# Patient Record
Sex: Female | Born: 1975 | Race: Black or African American | Hispanic: No | Marital: Single | Smoking: Never smoker
Health system: Southern US, Community
[De-identification: ages and names within clinical notes are randomized; demographics above are authoritative.]

## PROBLEM LIST (undated history)

## (undated) DIAGNOSIS — F419 Anxiety disorder, unspecified: Secondary | ICD-10-CM

## (undated) DIAGNOSIS — T7840XA Allergy, unspecified, initial encounter: Secondary | ICD-10-CM

## (undated) DIAGNOSIS — Z9889 Other specified postprocedural states: Secondary | ICD-10-CM

## (undated) DIAGNOSIS — D573 Sickle-cell trait: Secondary | ICD-10-CM

## (undated) DIAGNOSIS — E559 Vitamin D deficiency, unspecified: Secondary | ICD-10-CM

## (undated) DIAGNOSIS — F329 Major depressive disorder, single episode, unspecified: Secondary | ICD-10-CM

## (undated) DIAGNOSIS — R112 Nausea with vomiting, unspecified: Secondary | ICD-10-CM

## (undated) DIAGNOSIS — M722 Plantar fascial fibromatosis: Secondary | ICD-10-CM

## (undated) DIAGNOSIS — K219 Gastro-esophageal reflux disease without esophagitis: Secondary | ICD-10-CM

## (undated) DIAGNOSIS — D571 Sickle-cell disease without crisis: Secondary | ICD-10-CM

## (undated) DIAGNOSIS — G43909 Migraine, unspecified, not intractable, without status migrainosus: Secondary | ICD-10-CM

## (undated) DIAGNOSIS — Z302 Encounter for sterilization: Secondary | ICD-10-CM

## (undated) DIAGNOSIS — B009 Herpesviral infection, unspecified: Secondary | ICD-10-CM

## (undated) DIAGNOSIS — D649 Anemia, unspecified: Secondary | ICD-10-CM

## (undated) DIAGNOSIS — G44209 Tension-type headache, unspecified, not intractable: Secondary | ICD-10-CM

## (undated) HISTORY — DX: Anxiety disorder, unspecified: F41.9

## (undated) HISTORY — DX: Gastro-esophageal reflux disease without esophagitis: K21.9

## (undated) HISTORY — DX: Allergy, unspecified, initial encounter: T78.40XA

## (undated) HISTORY — DX: Sickle-cell disease without crisis: D57.1

## (undated) HISTORY — DX: Anemia, unspecified: D64.9

## (undated) HISTORY — PX: BREAST REDUCTION SURGERY: SHX8

## (undated) HISTORY — DX: Sickle-cell trait: D57.3

---

## 1997-12-07 ENCOUNTER — Other Ambulatory Visit: Admission: RE | Admit: 1997-12-07 | Discharge: 1997-12-07 | Payer: Self-pay | Admitting: Obstetrics

## 1998-08-20 ENCOUNTER — Inpatient Hospital Stay (HOSPITAL_COMMUNITY): Admission: AD | Admit: 1998-08-20 | Discharge: 1998-08-20 | Payer: Self-pay | Admitting: Obstetrics

## 1998-10-04 ENCOUNTER — Encounter: Payer: Self-pay | Admitting: *Deleted

## 1998-10-04 ENCOUNTER — Ambulatory Visit: Admission: RE | Admit: 1998-10-04 | Discharge: 1998-10-04 | Payer: Self-pay | Admitting: *Deleted

## 1999-03-30 ENCOUNTER — Other Ambulatory Visit: Admission: RE | Admit: 1999-03-30 | Discharge: 1999-03-30 | Payer: Self-pay | Admitting: Obstetrics

## 2000-03-11 ENCOUNTER — Other Ambulatory Visit: Admission: RE | Admit: 2000-03-11 | Discharge: 2000-03-11 | Payer: Self-pay | Admitting: Obstetrics

## 2000-03-18 ENCOUNTER — Encounter: Payer: Self-pay | Admitting: Obstetrics

## 2000-03-18 ENCOUNTER — Inpatient Hospital Stay (HOSPITAL_COMMUNITY): Admission: AD | Admit: 2000-03-18 | Discharge: 2000-03-18 | Payer: Self-pay | Admitting: Obstetrics

## 2000-07-06 ENCOUNTER — Inpatient Hospital Stay (HOSPITAL_COMMUNITY): Admission: AD | Admit: 2000-07-06 | Discharge: 2000-07-06 | Payer: Self-pay | Admitting: Obstetrics

## 2000-10-24 ENCOUNTER — Inpatient Hospital Stay (HOSPITAL_COMMUNITY): Admission: AD | Admit: 2000-10-24 | Discharge: 2000-10-24 | Payer: Self-pay | Admitting: Obstetrics

## 2000-11-07 ENCOUNTER — Inpatient Hospital Stay (HOSPITAL_COMMUNITY): Admission: AD | Admit: 2000-11-07 | Discharge: 2000-11-10 | Payer: Self-pay | Admitting: Obstetrics

## 2004-11-29 ENCOUNTER — Emergency Department (HOSPITAL_COMMUNITY): Admission: EM | Admit: 2004-11-29 | Discharge: 2004-11-29 | Payer: Self-pay | Admitting: Emergency Medicine

## 2006-04-26 ENCOUNTER — Emergency Department (HOSPITAL_COMMUNITY): Admission: EM | Admit: 2006-04-26 | Discharge: 2006-04-26 | Payer: Self-pay | Admitting: Family Medicine

## 2006-12-03 ENCOUNTER — Encounter: Admission: RE | Admit: 2006-12-03 | Discharge: 2007-03-03 | Payer: Self-pay | Admitting: Family Medicine

## 2008-05-26 ENCOUNTER — Encounter: Admission: RE | Admit: 2008-05-26 | Discharge: 2008-05-26 | Payer: Self-pay | Admitting: Obstetrics and Gynecology

## 2010-12-29 NOTE — Discharge Summary (Signed)
[facility]  Patient:    Elizabeth Key, Elizabeth Key                        MRN: [id number] Adm. Date:  [id number] Disc. Date: [id number] Attending:  Venita Sheffield                           Discharge Summary  HISTORY:                      The patient is a 35 year old gravida 2, para 1-0-0-1, [facility] November 11, 2000. She is a previous C-section and desired repeat C-section. She had a positive GBS which was treated with ampicillin at 36 weeks.  HOSPITAL COURSE:              On admission her hemoglobin was 10.6. Post C-section it was 9. White count was 9.2 and 10.3. The patient underwent a repeat low transverse cesarean section on November 07, 2000. She had a female, Apgars 8/9, weighing 7 pounds 13 ounces. Postoperatively, she did well and was discharged on the third postoperative day, ambulatory and on a regular diet.  DISCHARGE FOLLOWUP:           The patient is to see me in six weeks.  DISCHARGE DIAGNOSIS:          Status post repeat low transverse cesarean section at term. DD:  12/12/00 TD:  12/12/00 Job: [id number]

## 2010-12-29 NOTE — Op Note (Signed)
[facility]  Patient:    RAYNAH, GOMES                        MRN: [id number] Proc. Date: 11/07/00 Adm. Date:  [id number] Attending:  Venita Sheffield                           Operative Report  PREOPERATIVE DIAGNOSIS:       Previous cesarean section at term, desires repeat.  POSTOPERATIVE DIAGNOSIS:  OPERATION:                    Repeat cesarean section.  SURGEON:                      Kathreen Cosier, M.D.  ASSISTANT:                    Deniece Ree, M.D.  ANESTHESIA:                   Spinal by Dr. Arby Barrette.  DESCRIPTION OF PROCEDURE:     The patient was placed on the operating table in the supine position. The abdomen was prepped and draped. The bladder was emptied with a Foley catheter. A transverse suprapubic incision was made through the old scar, carried down to the rectus fascia. The fascia was cleanly incised the length of the incision. The rectus muscles were retracted laterally. The peritoneum was incised longitudinally. A transverse incision was made in the visceroperitoneum above the bladder and the bladder was mobilized inferiorly. A transverse lower uterine incision was made. The patient delivered from the OP position of a female, Apgars 8 and 9, weighing 7 pounds 13 ounces. The placenta was anterior, removed manually, and the fluid was clear. The uterine cavity was cleaned with dry lap. The team was in attendance. The uterine incision was closed in one layer with continuous suture of #1 chromic. Hemostasis was satisfactory. The bladder flap was reattached with 2-0 chromic. The uterus was well contracted. Tubes are ovaries were normal. The abdomen was closed in layers: the peritoneum with continuous suture of 0 chromic, the fascia with continuous suture of 0 Dexon, and the skin closed with subcuticular stitch of 0 plain. The patient tolerated the procedure well and taken to the recovery room in good condition. DD:   11/07/00 TD:  11/07/00 Job: [id number]

## 2011-07-28 ENCOUNTER — Encounter: Payer: Self-pay | Admitting: *Deleted

## 2011-07-28 ENCOUNTER — Emergency Department (INDEPENDENT_AMBULATORY_CARE_PROVIDER_SITE_OTHER)
Admission: EM | Admit: 2011-07-28 | Discharge: 2011-07-28 | Disposition: A | Discharge status: Home / Self Care | Payer: Self-pay | Source: Home / Self Care | Attending: Family Medicine | Admitting: Family Medicine

## 2011-07-28 DIAGNOSIS — J069 Acute upper respiratory infection, unspecified: Secondary | ICD-10-CM

## 2011-07-28 MED ORDER — GUAIFENESIN-CODEINE 100-10 MG/5ML PO SYRP: 10.0000 mL | ORAL_SOLUTION | Freq: Four times a day (QID) | ORAL | Status: AC | PRN

## 2011-07-28 NOTE — ED Notes (Signed)
jpt with c/o cough/congestion x 2 weeks

## 2011-07-28 NOTE — ED Provider Notes (Signed)
History     CSN: [id number] Arrival date & time: 07/28/2011  9:17 AM   First MD Initiated Contact with Patient 07/28/11 [phone]      Chief Complaint  Patient presents with  . Cough  . Nasal Congestion    (Consider location/radiation/quality/duration/timing/severity/associated sxs/prior treatment) Patient is a 35 y.o. female presenting with cough. The history is provided by the patient.  Cough This is a new problem. The current episode started more than 1 week ago. The problem occurs constantly. The problem has not changed since onset.The cough is non-productive. There has been no fever. Pertinent negatives include no rhinorrhea, no sore throat and no shortness of breath. She has tried cough syrup for the symptoms. The treatment provided no relief. She is not a smoker.    History reviewed. No pertinent past medical history.  Past Surgical History  Procedure Date  . Breast reduction surgery   . Cesarean section     History reviewed. No pertinent family history.  History  Substance Use Topics  . Smoking status: Never Smoker   . Smokeless tobacco: Not on file  . Alcohol Use: No    OB History    Grav Para Term Preterm Abortions TAB SAB Ect Mult Living                  Review of Systems  Constitutional: Negative.   HENT: Negative for sore throat and rhinorrhea.   Respiratory: Positive for cough. Negative for shortness of breath.     Allergies  Review of patient's allergies indicates no known allergies.  Home Medications   Current Outpatient Rx  Name Route Sig Dispense Refill  . GUAIFENESIN ER 600 MG PO TB12 Oral Take 1,200 mg by mouth 2 (two) times daily.      . GUAIFENESIN 100 MG/5ML PO SYRP Oral Take 200 mg by mouth 3 (three) times daily as needed.      . GUAIFENESIN-CODEINE 100-10 MG/5ML PO SYRP Oral Take 10 mLs by mouth 4 (four) times daily as needed for cough. 180 mL 0    BP 133/84  Pulse 84  Temp(Src) 99.1 F (37.3 C) (Oral)  Resp 16  SpO2 97%  LMP  07/28/2011  Physical Exam  Nursing note and vitals reviewed. Constitutional: She appears well-developed and well-nourished.  HENT:  Head: Normocephalic.  Right Ear: External ear normal.  Left Ear: External ear normal.  Mouth/Throat: Oropharynx is clear and moist.  Eyes: Pupils are equal, round, and reactive to light.  Neck: Normal range of motion. Neck supple.  Cardiovascular: Normal rate, normal heart sounds and intact distal pulses.   Pulmonary/Chest: Effort normal and breath sounds normal.  Skin: Skin is warm and dry.    ED Course  Procedures (including critical care time)  Labs Reviewed - No data to display No results found.   1. URI (upper respiratory infection)       MDM          Barkley Bruns, MD 07/28/11 [phone]

## 2011-08-03 ENCOUNTER — Emergency Department (HOSPITAL_COMMUNITY): Payer: Self-pay

## 2011-08-03 ENCOUNTER — Encounter (HOSPITAL_COMMUNITY): Payer: Self-pay | Admitting: *Deleted

## 2011-08-03 ENCOUNTER — Emergency Department (HOSPITAL_COMMUNITY)
Admission: EM | Admit: 2011-08-03 | Discharge: 2011-08-04 | Disposition: A | Discharge status: Home / Self Care | Payer: Self-pay | Attending: Emergency Medicine | Admitting: Emergency Medicine

## 2011-08-03 DIAGNOSIS — R059 Cough, unspecified: Secondary | ICD-10-CM | POA: Insufficient documentation

## 2011-08-03 DIAGNOSIS — N760 Acute vaginitis: Secondary | ICD-10-CM | POA: Insufficient documentation

## 2011-08-03 DIAGNOSIS — B9689 Other specified bacterial agents as the cause of diseases classified elsewhere: Secondary | ICD-10-CM | POA: Insufficient documentation

## 2011-08-03 DIAGNOSIS — A499 Bacterial infection, unspecified: Secondary | ICD-10-CM | POA: Insufficient documentation

## 2011-08-03 DIAGNOSIS — R062 Wheezing: Secondary | ICD-10-CM | POA: Insufficient documentation

## 2011-08-03 DIAGNOSIS — N949 Unspecified condition associated with female genital organs and menstrual cycle: Secondary | ICD-10-CM | POA: Insufficient documentation

## 2011-08-03 DIAGNOSIS — R079 Chest pain, unspecified: Secondary | ICD-10-CM | POA: Insufficient documentation

## 2011-08-03 DIAGNOSIS — R05 Cough: Secondary | ICD-10-CM | POA: Insufficient documentation

## 2011-08-03 DIAGNOSIS — Z79899 Other long term (current) drug therapy: Secondary | ICD-10-CM | POA: Insufficient documentation

## 2011-08-03 DIAGNOSIS — R1110 Vomiting, unspecified: Secondary | ICD-10-CM | POA: Insufficient documentation

## 2011-08-03 DIAGNOSIS — J40 Bronchitis, not specified as acute or chronic: Secondary | ICD-10-CM | POA: Insufficient documentation

## 2011-08-03 DIAGNOSIS — R109 Unspecified abdominal pain: Secondary | ICD-10-CM | POA: Insufficient documentation

## 2011-08-03 LAB — COMPREHENSIVE METABOLIC PANEL
ALT: 18 U/L (ref 0–35)
AST: 18 U/L (ref 0–37)
CO2: 29 mEq/L (ref 19–32)
Calcium: 9.5 mg/dL (ref 8.4–10.5)
Chloride: 101 mEq/L (ref 96–112)
GFR calc Af Amer: >90 mL/min (ref >90)
GFR calc non Af Amer: 79 mL/min — ABNORMAL LOW (ref >90)
Glucose, Bld: 109 mg/dL — ABNORMAL HIGH (ref 70–99)
Sodium: 139 mEq/L (ref 135–145)
Total Bilirubin: 0.4 mg/dL (ref 0.3–1.2)

## 2011-08-03 LAB — URINALYSIS, ROUTINE W REFLEX MICROSCOPIC
Glucose, UA: NEGATIVE mg/dL
Specific Gravity, Urine: 1.017 (ref 1.005–1.030)
pH: 5.5 (ref 5.0–8.0)

## 2011-08-03 LAB — CBC
HCT: 38.5 % (ref 36.0–46.0)
MCV: 80 fL (ref 78.0–100.0)
RBC: 4.81 MIL/uL (ref 3.87–5.11)
WBC: 13.4 10*3/uL — ABNORMAL HIGH (ref 4.0–10.5)

## 2011-08-03 LAB — URINE MICROSCOPIC-ADD ON

## 2011-08-03 LAB — DIFFERENTIAL
Basophils Absolute: 0 10*3/uL (ref 0.0–0.1)
Eosinophils Relative: 1 % (ref 0–5)
Lymphocytes Relative: 32 % (ref 12–46)
Lymphs Abs: 4.2 10*3/uL — ABNORMAL HIGH (ref 0.7–4.0)
Monocytes Absolute: 0.7 10*3/uL (ref 0.1–1.0)
Neutro Abs: 8.2 10*3/uL — ABNORMAL HIGH (ref 1.7–7.7)

## 2011-08-03 LAB — WET PREP, GENITAL: Trich, Wet Prep: NONE SEEN

## 2011-08-03 MED ORDER — FLUCONAZOLE 200 MG PO TABS: 200.0000 mg | ORAL_TABLET | Freq: Every day | ORAL | Status: AC

## 2011-08-03 MED ORDER — HYDROCODONE-HOMATROPINE 5-1.5 MG/5ML PO SYRP: 5.0000 mL | ORAL_SOLUTION | Freq: Four times a day (QID) | ORAL | Status: AC | PRN

## 2011-08-03 MED ORDER — AZITHROMYCIN 250 MG PO TABS: 250.0000 mg | ORAL_TABLET | Freq: Every day | ORAL | Status: AC

## 2011-08-03 MED ORDER — METRONIDAZOLE 500 MG PO TABS: 500.0000 mg | ORAL_TABLET | Freq: Two times a day (BID) | ORAL | Status: AC

## 2011-08-03 MED ORDER — ALBUTEROL SULFATE HFA 108 (90 BASE) MCG/ACT IN AERS
2.0000 | INHALATION_SPRAY | Freq: Once | RESPIRATORY_TRACT | Status: AC
  Administered 2011-08-03: 2 via RESPIRATORY_TRACT
  Filled 2011-08-03: qty 6.7

## 2011-08-03 MED ORDER — PREDNISONE 10 MG PO TABS: 40.0000 mg | ORAL_TABLET | Freq: Every day | ORAL | Status: AC

## 2011-08-03 NOTE — ED Notes (Addendum)
Pt in c/o cough x3-4 weeks, states her cough causes her to vomit at times, also recently noted pain to right suprapubic area

## 2011-08-03 NOTE — ED Provider Notes (Signed)
History     CSN: [id number]  Arrival date & time 08/03/11  2047   First MD Initiated Contact with Patient 08/03/11 2139      Chief Complaint  Patient presents with  . Flank Pain    hx of UTI  . Emesis    w/ cough, recently dx w/ URI  . Cough    (Consider location/radiation/quality/duration/timing/severity/associated sxs/prior treatment) HPI Comments: Pt with 3 week hx of mostly dry cough.  Seen at urgent care one week ago, dx with URI.  No fevers.  Has tightness to center of chest on coughing and pain across back with coughing.  No pleuritic pain.  No leg pain or swelling.  Also has one week hx of dull pain to right lower abd.  Feels somewhat like menstrual cramp.  Gradually worsening over one week.  No discharge or bleeding.  Patient is a 35 y.o. female presenting with cough.  Cough This is a new problem. Episode onset: 3 weeks ago. The problem has not changed since onset.The cough is non-productive. There has been no fever. Associated symptoms include chest pain and wheezing. Pertinent negatives include no chills, no sweats, no ear congestion, no headaches, no rhinorrhea, no myalgias and no shortness of breath. She has tried cough syrup for the symptoms. The treatment provided no relief. She is not a smoker.    History reviewed. No pertinent past medical history.  Past Surgical History  Procedure Date  . Breast reduction surgery   . Cesarean section     History reviewed. No pertinent family history.  History  Substance Use Topics  . Smoking status: Never Smoker   . Smokeless tobacco: Not on file  . Alcohol Use: No    OB History    Grav Para Term Preterm Abortions TAB SAB Ect Mult Living                  Review of Systems  Constitutional: Negative for fever, chills, diaphoresis and fatigue.  HENT: Negative for congestion, rhinorrhea and sneezing.   Eyes: Negative.   Respiratory: Positive for cough and wheezing. Negative for chest tightness and shortness of  breath.   Cardiovascular: Positive for chest pain. Negative for leg swelling.  Gastrointestinal: Positive for vomiting and abdominal pain. Negative for nausea, diarrhea and blood in stool.       Post tussive vomiting  Genitourinary: Negative for frequency, hematuria, flank pain, vaginal bleeding, vaginal discharge, difficulty urinating and vaginal pain.  Musculoskeletal: Negative for myalgias, back pain and arthralgias.  Skin: Negative for rash.  Neurological: Negative for dizziness, speech difficulty, weakness, numbness and headaches.    Allergies  Review of patient's allergies indicates no known allergies.  Home Medications   Current Outpatient Rx  Name Route Sig Dispense Refill  . GUAIFENESIN ER 600 MG PO TB12 Oral Take 1,200 mg by mouth 2 (two) times daily.      . GUAIFENESIN 100 MG/5ML PO SYRP Oral Take 200 mg by mouth 3 (three) times daily as needed.      . GUAIFENESIN-CODEINE 100-10 MG/5ML PO SYRP Oral Take 10 mLs by mouth 4 (four) times daily as needed for cough. 180 mL 0  . AZITHROMYCIN 250 MG PO TABS Oral Take 1 tablet (250 mg total) by mouth daily. Take first 2 tablets together, then 1 every day until finished. 6 tablet 0  . FLUCONAZOLE 200 MG PO TABS Oral Take 1 tablet (200 mg total) by mouth daily. 1 tablet 0  . HYDROCODONE-HOMATROPINE 5-1.5 MG/5ML PO  SYRP Oral Take 5 mLs by mouth every 6 (six) hours as needed for cough. 120 mL 0  . METRONIDAZOLE 500 MG PO TABS Oral Take 1 tablet (500 mg total) by mouth 2 (two) times daily. 14 tablet 0  . PREDNISONE 10 MG PO TABS Oral Take 4 tablets (40 mg total) by mouth daily. 20 tablet 0    BP 130/87  Pulse 104  Temp(Src) 98.8 F (37.1 C) (Oral)  Resp 18  SpO2 97%  LMP 07/28/2011  Physical Exam  Constitutional: She is oriented to person, place, and time. She appears well-developed and well-nourished.  HENT:  Head: Normocephalic and atraumatic.  Eyes: Pupils are equal, round, and reactive to light.  Neck: Normal range of motion.  Neck supple.  Cardiovascular: Normal rate, regular rhythm and normal heart sounds.   Pulmonary/Chest: Effort normal. No respiratory distress. She has wheezes. She has no rales. She exhibits no tenderness.       Slight wheezes on end expiration  Abdominal: Soft. Bowel sounds are normal. There is tenderness. There is no rebound and no guarding.       Mild tenderness to right pelvic area  Musculoskeletal: Normal range of motion. She exhibits no edema.  Lymphadenopathy:    She has no cervical adenopathy.  Neurological: She is alert and oriented to person, place, and time.  Skin: Skin is warm and dry. No rash noted.  Psychiatric: She has a normal mood and affect.    ED Course  Procedures (including critical care time)  Results for orders placed during the hospital encounter of 08/03/11  URINALYSIS, ROUTINE W REFLEX MICROSCOPIC      Component Value Range   Color, Urine YELLOW  YELLOW    APPearance CLOUDY (*) CLEAR    Specific Gravity, Urine 1.017  1.005 - 1.030    pH 5.5  5.0 - 8.0    Glucose, UA NEGATIVE  NEGATIVE (mg/dL)   Hgb urine dipstick TRACE (*) NEGATIVE    Bilirubin Urine NEGATIVE  NEGATIVE    Ketones, ur NEGATIVE  NEGATIVE (mg/dL)   Protein, ur NEGATIVE  NEGATIVE (mg/dL)   Urobilinogen, UA 1.0  0.0 - 1.0 (mg/dL)   Nitrite NEGATIVE  NEGATIVE    Leukocytes, UA SMALL (*) NEGATIVE   CBC      Component Value Range   WBC 13.4 (*) 4.0 - 10.5 (K/uL)   RBC 4.81  3.87 - 5.11 (MIL/uL)   Hemoglobin 13.3  12.0 - 15.0 (g/dL)   HCT 86.5  78.4 - 69.6 (%)   MCV 80.0  78.0 - 100.0 (fL)   MCH 27.7  26.0 - 34.0 (pg)   MCHC 34.5  30.0 - 36.0 (g/dL)   RDW 29.5  28.4 - 13.2 (%)   Platelets 357  150 - 400 (K/uL)  DIFFERENTIAL      Component Value Range   Neutrophils Relative 61  43 - 77 (%)   Neutro Abs 8.2 (*) 1.7 - 7.7 (K/uL)   Lymphocytes Relative 32  12 - 46 (%)   Lymphs Abs 4.2 (*) 0.7 - 4.0 (K/uL)   Monocytes Relative 6  3 - 12 (%)   Monocytes Absolute 0.7  0.1 - 1.0 (K/uL)    Eosinophils Relative 1  0 - 5 (%)   Eosinophils Absolute 0.2  0.0 - 0.7 (K/uL)   Basophils Relative 0  0 - 1 (%)   Basophils Absolute 0.0  0.0 - 0.1 (K/uL)  COMPREHENSIVE METABOLIC PANEL      Component Value Range  Sodium 139  135 - 145 (mEq/L)   Potassium 3.4 (*) 3.5 - 5.1 (mEq/L)   Chloride 101  96 - 112 (mEq/L)   CO2 29  19 - 32 (mEq/L)   Glucose, Bld 109 (*) 70 - 99 (mg/dL)   BUN 10  6 - 23 (mg/dL)   Creatinine, Ser 0.45  0.50 - 1.10 (mg/dL)   Calcium 9.5  8.4 - 40.9 (mg/dL)   Total Protein 7.5  6.0 - 8.3 (g/dL)   Albumin 3.8  3.5 - 5.2 (g/dL)   AST 18  0 - 37 (U/L)   ALT 18  0 - 35 (U/L)   Alkaline Phosphatase 84  39 - 117 (U/L)   Total Bilirubin 0.4  0.3 - 1.2 (mg/dL)   GFR calc non Af Amer 79 (*) >90 (mL/min)   GFR calc Af Amer >90  >90 (mL/min)  POCT PREGNANCY, URINE      Component Value Range   Preg Test, Ur NEGATIVE    URINE MICROSCOPIC-ADD ON      Component Value Range   Squamous Epithelial / LPF MANY (*) RARE    WBC, UA 0-2  <3 (WBC/hpf)   Bacteria, UA MANY (*) RARE   WET PREP, GENITAL      Component Value Range   Yeast, Wet Prep NONE SEEN  NONE SEEN    Trich, Wet Prep NONE SEEN  NONE SEEN    Clue Cells, Wet Prep FEW (*) NONE SEEN    WBC, Wet Prep HPF POC MANY (*) NONE SEEN    Dg Chest 2 View  08/03/2011  *RADIOLOGY REPORT*  Clinical Data: Cough.  Chest and back pain.  Nausea vomiting.  CHEST - 2 VIEW  Comparison:  None.  Findings:  The heart size and mediastinal contours are within normal limits.  Low lung volumes are noted.  Both lungs are clear. The visualized skeletal structures are unremarkable.  IMPRESSION: Low lung volumes.  No active disease.  Original Report Authenticated By: Danae Orleans, M.D.     1. Bronchitis   2. Bacterial vaginosis       MDM  Pt with bronchitis symptoms.  No pneumonia.  Since has been going on for 3 weeks, will do abx/steroids/albuterol.  Doubt PE.  Mild R lower pelvic pain.  Not consistent with torsion/appendicitis.  Will  tx for BV, f/u with her gyn        Rolan Bucco, MD 08/03/11 2358

## 2011-08-03 NOTE — ED Notes (Signed)
Patient sts that she has had this cough x 2-3 wks and has been seen at urgent care for it. sts that she coughs so hard and then she vomits.  sts small pain in rlq and then it radiates upward

## 2011-08-04 LAB — GC/CHLAMYDIA PROBE AMP, GENITAL
Chlamydia, DNA Probe: NEGATIVE
GC Probe Amp, Genital: NEGATIVE

## 2012-05-22 ENCOUNTER — Emergency Department (HOSPITAL_COMMUNITY)
Admission: EM | Admit: 2012-05-22 | Discharge: 2012-05-22 | Disposition: A | Discharge status: Home / Self Care | Payer: No Typology Code available for payment source | Attending: Emergency Medicine | Admitting: Emergency Medicine

## 2012-05-22 ENCOUNTER — Encounter (HOSPITAL_COMMUNITY): Payer: Self-pay | Admitting: *Deleted

## 2012-05-22 ENCOUNTER — Emergency Department (HOSPITAL_COMMUNITY): Payer: No Typology Code available for payment source

## 2012-05-22 DIAGNOSIS — Z91013 Allergy to seafood: Secondary | ICD-10-CM | POA: Insufficient documentation

## 2012-05-22 DIAGNOSIS — Y92410 Unspecified street and highway as the place of occurrence of the external cause: Secondary | ICD-10-CM | POA: Insufficient documentation

## 2012-05-22 DIAGNOSIS — S43409A Unspecified sprain of unspecified shoulder joint, initial encounter: Secondary | ICD-10-CM

## 2012-05-22 DIAGNOSIS — IMO0002 Reserved for concepts with insufficient information to code with codable children: Secondary | ICD-10-CM | POA: Insufficient documentation

## 2012-05-22 MED ORDER — NAPROXEN 375 MG PO TABS: 375.0000 mg | ORAL_TABLET | Freq: Two times a day (BID) | ORAL | Status: DC

## 2012-05-22 MED ORDER — HYDROCODONE-ACETAMINOPHEN 5-500 MG PO TABS: 1.0000 | ORAL_TABLET | Freq: Four times a day (QID) | ORAL | Status: DC | PRN
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
## 2012-05-22 NOTE — ED Notes (Signed)
Pt verbalizes understanding 

## 2012-05-24 NOTE — ED Provider Notes (Signed)
Medical screening examination/treatment/procedure(s) were performed by non-physician practitioner and as supervising physician I was immediately available for consultation/collaboration.    Takela Varden R Omarrion Carmer, MD 05/24/12 1651 

## 2012-05-28 ENCOUNTER — Other Ambulatory Visit: Payer: Self-pay | Admitting: Chiropractor

## 2012-05-28 ENCOUNTER — Ambulatory Visit
Admission: RE | Admit: 2012-05-28 | Discharge: 2012-05-28 | Disposition: A | Discharge status: Home / Self Care | Payer: Self-pay | Source: Ambulatory Visit | Attending: Chiropractor | Admitting: Chiropractor

## 2012-05-28 DIAGNOSIS — M542 Cervicalgia: Secondary | ICD-10-CM

## 2012-12-10 DIAGNOSIS — G43909 Migraine, unspecified, not intractable, without status migrainosus: Secondary | ICD-10-CM | POA: Insufficient documentation

## 2013-01-18 ENCOUNTER — Emergency Department (HOSPITAL_COMMUNITY)
Admission: EM | Admit: 2013-01-18 | Discharge: 2013-01-18 | Disposition: A | Discharge status: Home / Self Care | Payer: Managed Care, Other (non HMO) | Attending: Emergency Medicine | Admitting: Emergency Medicine

## 2013-01-18 ENCOUNTER — Encounter (HOSPITAL_COMMUNITY): Payer: Self-pay

## 2013-01-18 DIAGNOSIS — N390 Urinary tract infection, site not specified: Secondary | ICD-10-CM

## 2013-01-18 DIAGNOSIS — Z79899 Other long term (current) drug therapy: Secondary | ICD-10-CM | POA: Insufficient documentation

## 2013-01-18 DIAGNOSIS — R3915 Urgency of urination: Secondary | ICD-10-CM | POA: Insufficient documentation

## 2013-01-18 DIAGNOSIS — Z3202 Encounter for pregnancy test, result negative: Secondary | ICD-10-CM | POA: Insufficient documentation

## 2013-01-18 DIAGNOSIS — R350 Frequency of micturition: Secondary | ICD-10-CM | POA: Insufficient documentation

## 2013-01-18 LAB — URINALYSIS W MICROSCOPIC + REFLEX CULTURE
Glucose, UA: NEGATIVE mg/dL
Protein, ur: NEGATIVE mg/dL
Urobilinogen, UA: 1 mg/dL (ref 0.0–1.0)

## 2013-01-18 MED ORDER — NITROFURANTOIN MONOHYD MACRO 100 MG PO CAPS
100.0000 mg | ORAL_CAPSULE | Freq: Once | ORAL | Status: AC
  Administered 2013-01-18: 100 mg via ORAL
  Filled 2013-01-18: qty 1

## 2013-01-18 MED ORDER — PHENAZOPYRIDINE HCL 100 MG PO TABS
95.0000 mg | ORAL_TABLET | Freq: Once | ORAL | Status: AC
  Administered 2013-01-18: 100 mg via ORAL
  Filled 2013-01-18: qty 1

## 2013-01-18 MED ORDER — NITROFURANTOIN MONOHYD MACRO 100 MG PO CAPS: 100.0000 mg | ORAL_CAPSULE | Freq: Two times a day (BID) | ORAL | Status: DC

## 2013-01-18 MED ORDER — PHENAZOPYRIDINE HCL 200 MG PO TABS: 200.0000 mg | ORAL_TABLET | Freq: Three times a day (TID) | ORAL | Status: DC

## 2013-01-18 NOTE — ED Notes (Signed)
Pt c/o of dysuria starting this am with frequent urination.

## 2013-01-18 NOTE — ED Provider Notes (Signed)
History     CSN: [id number]  Arrival date & time 01/18/13  0530   First MD Initiated Contact with Patient 01/18/13 0602      Chief Complaint  Patient presents with  . Dysuria    (Consider location/radiation/quality/duration/timing/severity/associated sxs/prior treatment) HPI  37 year old female presents complaining of dysuria.patient report when urinating this morning she felt a burning sensation had increased urinary frequency and urgency. Symptoms felt similar to prior urinary tract infection back in 2010. She has no prior symptoms until this morning. She is currently on her menstruation. Patient denies fever, nausea, vomiting, diarrhea, abdominal pain, back pain, vaginal discharge, or rash. No specific treatment tried.  History reviewed. No pertinent past medical history.  Past Surgical History  Procedure Laterality Date  . Breast reduction surgery    . Cesarean section      No family history on file.  History  Substance Use Topics  . Smoking status: Never Smoker   . Smokeless tobacco: Not on file  . Alcohol Use: No    OB History   Grav Para Term Preterm Abortions TAB SAB Ect Mult Living                  Review of Systems  Constitutional: Negative for fever.  Genitourinary: Positive for dysuria and frequency. Negative for hematuria and flank pain.  Skin: Negative for rash.    Allergies  Shellfish allergy  Home Medications   Current Outpatient Rx  Name  Route  Sig  Dispense  Refill  . diclofenac (VOLTAREN) 75 MG EC tablet   Oral   Take 75 mg by mouth 2 (two) times daily as needed. For inflammation of foot         . FLUoxetine (PROZAC) 20 MG capsule   Oral   Take 20 mg by mouth every morning.         Marland Kitchen HYDROcodone-acetaminophen (VICODIN) 5-500 MG per tablet   Oral   Take 1-2 tablets by mouth every 6 (six) hours as needed for pain.   15 tablet   0   . naproxen (NAPROSYN) 375 MG tablet   Oral   Take 1 tablet (375 mg total) by mouth 2 (two)  times daily.   20 tablet   0     BP 119/71  Pulse 94  Temp(Src) 98.8 F (37.1 C) (Oral)  Resp 20  Ht 5\' 5"  (1.651 m)  Wt 225 lb (102.059 kg)  BMI 37.44 kg/m2  SpO2 99%  LMP 01/18/2013  Physical Exam  Nursing note and vitals reviewed. Constitutional: She appears well-developed and well-nourished. No distress.  HENT:  Head: Atraumatic.  Eyes: Conjunctivae are normal.  Neck: Neck supple.  Abdominal: Soft. There is no tenderness.  Genitourinary:   No CVA tenderness  Neurological: She is alert.  Skin: Skin is warm. No rash noted.  Psychiatric: She has a normal mood and affect.    ED Course  Procedures (including critical care time)  6:08 AM Pt presents with dysuria.  No other complaint.  abd soft and nontender, no cva tenderness, currently on her menstruation.    6:34 AM UA is positive for UTI. Patient will be treated with Macrobid and Pyridium.  Labs Reviewed  URINALYSIS W MICROSCOPIC + REFLEX CULTURE - Abnormal; Notable for the following:    APPearance CLOUDY (*)    Hgb urine dipstick LARGE (*)    Leukocytes, UA LARGE (*)    Bacteria, UA MANY (*)    All other components within normal limits  URINE CULTURE  PREGNANCY, URINE   No results found.   1. UTI (urinary tract infection)       MDM  BP 122/75  Pulse 85  Temp(Src) 98.1 F (36.7 C) (Oral)  Resp 18  Ht 5\' 5"  (1.651 m)  Wt 225 lb (102.059 kg)  BMI 37.44 kg/m2  SpO2 97%  LMP 01/18/2013         Fayrene Helper, PA-C 01/18/13 [phone]

## 2013-01-18 NOTE — ED Notes (Signed)
Pt dc to home. Pt sts understanding to dc instructions. Pt ambulatory to exit without difficulty. 

## 2013-01-18 NOTE — ED Provider Notes (Signed)
Medical screening examination/treatment/procedure(s) were conducted as a shared visit with non-physician practitioner(s) and myself.  I personally evaluated the patient during the encounter  No suprapubic tenderness.   Hanley Seamen, MD 01/18/13 [phone]

## 2013-01-20 LAB — URINE CULTURE: Colony Count: >=100000

## 2013-01-21 ENCOUNTER — Telehealth (HOSPITAL_COMMUNITY): Payer: Self-pay | Admitting: Emergency Medicine

## 2013-01-21 NOTE — Progress Notes (Signed)
  ED Antimicrobial Stewardship Positive Culture Follow Up   Elizabeth Key is an 37 y.o. female who presented to [facility] on 01/18/2013 with a chief complaint of dysuria, frequent urination.  Chief Complaint  Patient presents with  . Dysuria    Recent Results (from the past 720 hour(s))  URINE CULTURE     Status: None   Collection Time    01/18/13  5:52 AM      Result Value Range Status   Specimen Description URINE, CLEAN CATCH   Final   Special Requests CX ADDED AT 0615 ON [id number]   Final   Culture  Setup Time 01/18/2013 06:17   Final   Colony Count >=100,000 COLONIES/ML   Final   Culture CITROBACTER KOSERI   Final   Report Status 01/20/2013 FINAL   Final   Organism ID, Bacteria CITROBACTER KOSERI   Final    [x]  Treated with Nitrofurantion, organism sensitive but with high MIC to prescribed antimicrobial []  Patient discharged originally without antimicrobial agent and treatment is now indicated  Recommendation: Perform symptom check. If dysuria persists, see alternative antibiotic below.  New antibiotic prescription: Keflex 500mg  PO TID x 7 days  ED Provider: Jaynie Crumble, PA-C   Cleon Dew 01/21/2013, 10:37 AM Infectious Diseases Pharmacist Phone# [phone]

## 2013-01-21 NOTE — ED Notes (Signed)
Post ED Visit - Positive Culture Follow-up: Successful Patient Follow-Up  Culture assessed and recommendations reviewed by: [x]  Wes Dulaney, Pharm.D., BCPS []  Celedonio Miyamoto, Pharm.D., BCPS []  Georgina Pillion, Pharm.D., BCPS []  Bowler, [address].D., BCPS, AAHIVP []  Estella Husk, Pharm.D., BCPS, AAHIVP  Positive Urine culture  []  Patient discharged without antimicrobial prescription and treatment is now indicated [x]  Organism is resistant to prescribed ED discharge antimicrobial []  Patient with positive blood cultures  Changes discussed with ED provider: Tayana New antibiotic prescription Keflex 500 mg po TID x 7 days    Larena Sox 01/21/2013, 2:58 PM

## 2013-01-22 ENCOUNTER — Telehealth (HOSPITAL_COMMUNITY): Payer: Self-pay | Admitting: Emergency Medicine

## 2013-01-23 ENCOUNTER — Telehealth (HOSPITAL_COMMUNITY): Payer: Self-pay | Admitting: *Deleted

## 2013-01-25 ENCOUNTER — Telehealth (HOSPITAL_COMMUNITY): Payer: Self-pay | Admitting: Emergency Medicine

## 2013-01-25 NOTE — ED Notes (Signed)
Unable to contact patient via phone. Sent letter. °

## 2013-01-30 ENCOUNTER — Telehealth (HOSPITAL_COMMUNITY): Payer: Self-pay | Admitting: Emergency Medicine

## 2013-06-09 ENCOUNTER — Ambulatory Visit (INDEPENDENT_AMBULATORY_CARE_PROVIDER_SITE_OTHER): Payer: Managed Care, Other (non HMO) | Admitting: Podiatry

## 2013-06-09 ENCOUNTER — Encounter: Payer: Self-pay | Admitting: Podiatry

## 2013-06-09 ENCOUNTER — Ambulatory Visit (INDEPENDENT_AMBULATORY_CARE_PROVIDER_SITE_OTHER): Payer: Managed Care, Other (non HMO)

## 2013-06-09 VITALS — BP 121/81 | HR 86 | Resp 16 | Ht 65.0 in | Wt 212.0 lb

## 2013-06-09 DIAGNOSIS — M79609 Pain in unspecified limb: Secondary | ICD-10-CM

## 2013-06-09 DIAGNOSIS — M79672 Pain in left foot: Secondary | ICD-10-CM

## 2013-06-09 DIAGNOSIS — M76822 Posterior tibial tendinitis, left leg: Secondary | ICD-10-CM

## 2013-06-09 DIAGNOSIS — M722 Plantar fascial fibromatosis: Secondary | ICD-10-CM

## 2013-06-09 DIAGNOSIS — M76829 Posterior tibial tendinitis, unspecified leg: Secondary | ICD-10-CM

## 2013-06-09 MED ORDER — MELOXICAM 15 MG PO TABS: 15.0000 mg | ORAL_TABLET | Freq: Every day | ORAL | Status: DC

## 2013-06-09 MED ORDER — METHYLPREDNISOLONE (PAK) 4 MG PO TABS: ORAL_TABLET | ORAL | Status: DC

## 2013-06-09 NOTE — Progress Notes (Signed)
Elizabeth Key presents today with a chief complaint of a painful left foot and ankle. She cannot relate exactly when it started. I can state that he started with the heel in the left foot. States that she has a sharp burning pain along the medial aspect of the foot as she points from the medial malleolus distally toward the hallux. Denies any treatment other than anti-inflammatories ice therapy. Denies any trauma. We treated her in the past for plantar fasciitis right foot.  Objective: I have reviewed her past medical history her medications and her allergies. Vital signs are stable she is alert and oriented x3. Strong palpable pulses bilateral lower extremity. She has pain on palpation of the posterior tibial tendon left she has pain on percussion of the tarsal tunnel left with radiating pain proximally and distally. She has pain on direct palpation of the medial calcaneal tubercle of the left heel. No pain on medial lateral compression of the heel. Radiographic evaluation does demonstrate soft tissue increase in density at the plantar fascial calcaneal insertion site. And some soft tissue swelling around the posterior tibial tendon.  Assessment: Plantar fasciitis left with compensatory syndrome. This includes tibialis posterior tendinitis and lateral compensatory syndrome.  Plan: We discussed etiology pathology conservative versus surgical therapies. Injected 20 mg of Kenalog to the plantar fascial calcaneal insertion site of the left heel. Plantar fascial strapping was applied. Night splint dispensed. We discussed the appropriate shoe gear stretching exercises and ice therapy. I wrote a prescription for Medrol Dosepak to be followed by Mobic. I will followup with her in one month.

## 2013-07-14 ENCOUNTER — Ambulatory Visit: Payer: Managed Care, Other (non HMO) | Admitting: Podiatry

## 2013-07-28 ENCOUNTER — Ambulatory Visit: Payer: Managed Care, Other (non HMO) | Admitting: Podiatry

## 2013-09-21 DIAGNOSIS — G44209 Tension-type headache, unspecified, not intractable: Secondary | ICD-10-CM | POA: Insufficient documentation

## 2013-12-09 DIAGNOSIS — F339 Major depressive disorder, recurrent, unspecified: Secondary | ICD-10-CM | POA: Insufficient documentation

## 2014-02-23 ENCOUNTER — Encounter (HOSPITAL_COMMUNITY)
Admission: RE | Admit: 2014-02-23 | Discharge: 2014-02-23 | Disposition: A | Discharge status: Home / Self Care | Payer: Managed Care, Other (non HMO) | Source: Ambulatory Visit | Attending: Obstetrics and Gynecology | Admitting: Obstetrics and Gynecology

## 2014-02-23 ENCOUNTER — Encounter (HOSPITAL_COMMUNITY): Payer: Self-pay

## 2014-02-23 DIAGNOSIS — Z302 Encounter for sterilization: Secondary | ICD-10-CM | POA: Diagnosis present

## 2014-02-23 DIAGNOSIS — F3289 Other specified depressive episodes: Secondary | ICD-10-CM | POA: Diagnosis not present

## 2014-02-23 DIAGNOSIS — G43909 Migraine, unspecified, not intractable, without status migrainosus: Secondary | ICD-10-CM | POA: Diagnosis not present

## 2014-02-23 DIAGNOSIS — N838 Other noninflammatory disorders of ovary, fallopian tube and broad ligament: Secondary | ICD-10-CM | POA: Diagnosis not present

## 2014-02-23 DIAGNOSIS — F329 Major depressive disorder, single episode, unspecified: Secondary | ICD-10-CM | POA: Diagnosis not present

## 2014-02-23 LAB — CBC
HEMATOCRIT: 38.3 % (ref 36.0–46.0)
HEMOGLOBIN: 13.3 g/dL (ref 12.0–15.0)
MCH: 28.1 pg (ref 26.0–34.0)
MCHC: 34.7 g/dL (ref 30.0–36.0)
MCV: 81 fL (ref 78.0–100.0)
Platelets: 314 10*3/uL (ref 150–400)
RBC: 4.73 MIL/uL (ref 3.87–5.11)
RDW: 12.6 % (ref 11.5–15.5)
WBC: 11.6 10*3/uL — AB (ref 4.0–10.5)

## 2014-02-23 NOTE — Patient Instructions (Signed)
61 [address] Mystiqu Worden  02/23/2014   Your procedure is scheduled on:  02/26/14  Enter through the Main Entrance of [facility] at [place] up the phone at the desk and dial [phone].   Call this number if you have problems the morning of surgery: [phone]   Remember:   Do not eat food:After Midnight.  Do not drink clear liquids: After Midnight.  Take these medicines the morning of surgery with A SIP OF WATER: NA   Do not wear jewelry, make-up or nail polish.  Do not wear lotions, powders, or perfumes. You may wear deodorant.  Do not shave 48 hours prior to surgery.  Do not bring valuables to the hospital.  [facility] is not   responsible for any belongings or valuables brought to the hospital.  Contacts, dentures or bridgework may not be worn into surgery.  Leave suitcase in the car. After surgery it may be brought to your room.  For patients admitted to the hospital, checkout time is 11:00 AM the day of              discharge.   Patients discharged the day of surgery will not be allowed to drive             home.  Name and phone number of your driver: Darlyne Russian    Special Instructions:      Please read over the following fact sheets that you were given:   Surgical Site Infection Prevention

## 2014-02-25 ENCOUNTER — Encounter (HOSPITAL_COMMUNITY): Payer: Self-pay | Admitting: Obstetrics and Gynecology

## 2014-02-25 DIAGNOSIS — Z302 Encounter for sterilization: Secondary | ICD-10-CM

## 2014-02-25 HISTORY — DX: Encounter for sterilization: Z30.2

## 2014-02-25 NOTE — H&P (Signed)
Elizabeth Key is an 38 y.o. female [phone] for permanent sterilization.  Patient's children are teenagers has no desire for future pregnancies.  D/W pt r/b/a of BTL by salpingectomy, pt desires.  Voices understanding.  Not happy with other contraceptive methods.    Pertinent Gynecological History: Menses: regular every month without intermenstrual spotting, pt c/o of heaviness and crampiness Bleeding: menorrhagia Contraception: abstinence Blood transfusions: none Sexually transmitted diseases: past history: Chl and HSV 1 and 2 Previous GYN Procedures: DNC  Last pap: normal HR HPV neg Date: 8/14 OB History: G3, P2012, G1 and 2 LTCS, G 3 TAB   Menstrual History: No LMP recorded.    Past Medical History  Diagnosis Date  . Headache(784.0)   h/o depression  Past Surgical History  Procedure Laterality Date  . Breast reduction surgery    . Cesarean section     X 2 D&C Plantar fascitis  FH: alzheimer's, DM, HTN, CAD, prostate CA, lung CA  Social History:  reports that she has never smoked. She does not have any smokeless tobacco history on file. She reports that she does not drink alcohol or use illicit drugs.customer service, divorced  Allergies:  Allergies  Allergen Reactions  . Shellfish Allergy Anaphylaxis    ALL SEAFOOD causes this reaction  NKDA  No prescriptions prior to admission  Meds Vit D, valtrex, topiramate, sumatriptan, phenergan, meloxicam, fluoxetine, diclofenac   Review of Systems  Constitutional: Negative.   HENT: Negative.   Eyes: Negative.   Respiratory: Negative.   Cardiovascular: Negative.   Gastrointestinal: Negative.   Genitourinary: Negative.   Musculoskeletal: Negative.   Skin: Negative.   Neurological: Negative.   Psychiatric/Behavioral: Negative.     There were no vitals taken for this visit. Physical Exam  Constitutional: She is oriented to person, place, and time. She appears well-developed and well-nourished.  HENT:  Head:  Normocephalic and atraumatic.  Cardiovascular: Normal rate and regular rhythm.   Respiratory: Effort normal and breath sounds normal. No respiratory distress. She has no wheezes.  GI: Soft. Bowel sounds are normal. She exhibits no distension. There is no tenderness.  Musculoskeletal: Normal range of motion.  Neurological: She is alert and oriented to person, place, and time.  Skin: Skin is warm and dry.  Psychiatric: She has a normal mood and affect. Her behavior is normal.    No results found for this or any previous visit (from the past 24 hour(s)).  No results found.  Assessment/Plan: 76AU [id number] for BTL by B salpingectomy.  D/W pt r/b/a - wishes to proceed.   Bovard-Stuckert, Allana Shrestha 02/25/2014, 8:58 PM

## 2014-02-26 ENCOUNTER — Ambulatory Visit (HOSPITAL_COMMUNITY): Payer: Managed Care, Other (non HMO) | Admitting: Anesthesiology

## 2014-02-26 ENCOUNTER — Ambulatory Visit (HOSPITAL_COMMUNITY)
Admission: RE | Admit: 2014-02-26 | Discharge: 2014-02-26 | Disposition: A | Discharge status: Home / Self Care | Payer: Managed Care, Other (non HMO) | Source: Ambulatory Visit | Attending: Obstetrics and Gynecology | Admitting: Obstetrics and Gynecology

## 2014-02-26 ENCOUNTER — Encounter (HOSPITAL_COMMUNITY)
Admission: RE | Disposition: A | Discharge status: Home / Self Care | Payer: Self-pay | Source: Ambulatory Visit | Attending: Obstetrics and Gynecology

## 2014-02-26 ENCOUNTER — Encounter (HOSPITAL_COMMUNITY): Payer: Managed Care, Other (non HMO) | Admitting: Anesthesiology

## 2014-02-26 DIAGNOSIS — Z9851 Tubal ligation status: Secondary | ICD-10-CM

## 2014-02-26 DIAGNOSIS — F329 Major depressive disorder, single episode, unspecified: Secondary | ICD-10-CM | POA: Insufficient documentation

## 2014-02-26 DIAGNOSIS — N838 Other noninflammatory disorders of ovary, fallopian tube and broad ligament: Secondary | ICD-10-CM | POA: Insufficient documentation

## 2014-02-26 DIAGNOSIS — Z302 Encounter for sterilization: Secondary | ICD-10-CM | POA: Diagnosis not present

## 2014-02-26 DIAGNOSIS — F3289 Other specified depressive episodes: Secondary | ICD-10-CM | POA: Insufficient documentation

## 2014-02-26 DIAGNOSIS — G43909 Migraine, unspecified, not intractable, without status migrainosus: Secondary | ICD-10-CM | POA: Insufficient documentation

## 2014-02-26 HISTORY — PX: LAPAROSCOPIC BILATERAL SALPINGECTOMY: SHX5889

## 2014-02-26 HISTORY — DX: Encounter for sterilization: Z30.2

## 2014-02-26 LAB — PREGNANCY, URINE: PREG TEST UR: NEGATIVE

## 2014-02-26 SURGERY — SALPINGECTOMY, BILATERAL, LAPAROSCOPIC
Anesthesia: General | Site: Abdomen | Laterality: Bilateral

## 2014-02-26 MED ORDER — METHYLENE BLUE 1 % INJ SOLN
INTRAMUSCULAR | Status: AC
  Filled 2014-02-26: qty 1

## 2014-02-26 MED ORDER — ONDANSETRON HCL 4 MG/2ML IJ SOLN
INTRAMUSCULAR | Status: DC | PRN
  Administered 2014-02-26: 4 mg via INTRAVENOUS

## 2014-02-26 MED ORDER — PROPOFOL 10 MG/ML IV EMUL
INTRAVENOUS | Status: AC
  Filled 2014-02-26: qty 40

## 2014-02-26 MED ORDER — GLYCOPYRROLATE 0.2 MG/ML IJ SOLN
INTRAMUSCULAR | Status: DC | PRN
  Administered 2014-02-26: 0.6 mg via INTRAVENOUS

## 2014-02-26 MED ORDER — NAPROXEN SODIUM ER 500 MG PO TB24: 500.0000 mg | ORAL_TABLET | Freq: Every day | ORAL | Status: DC

## 2014-02-26 MED ORDER — MIDAZOLAM HCL 2 MG/2ML IJ SOLN
INTRAMUSCULAR | Status: AC
  Filled 2014-02-26: qty 2

## 2014-02-26 MED ORDER — KETOROLAC TROMETHAMINE 30 MG/ML IJ SOLN
INTRAMUSCULAR | Status: DC | PRN
  Administered 2014-02-26: 30 mg via INTRAVENOUS

## 2014-02-26 MED ORDER — ACETAMINOPHEN 160 MG/5ML PO SOLN
975.0000 mg | Freq: Once | ORAL | Status: AC
  Administered 2014-02-26: 975 mg via ORAL

## 2014-02-26 MED ORDER — DEXAMETHASONE SODIUM PHOSPHATE 10 MG/ML IJ SOLN
INTRAMUSCULAR | Status: AC
  Filled 2014-02-26: qty 1

## 2014-02-26 MED ORDER — ACETAMINOPHEN 160 MG/5ML PO SOLN
ORAL | Status: AC
  Filled 2014-02-26: qty 40.6

## 2014-02-26 MED ORDER — MIDAZOLAM HCL 2 MG/2ML IJ SOLN
INTRAMUSCULAR | Status: DC | PRN
  Administered 2014-02-26: 2 mg via INTRAVENOUS

## 2014-02-26 MED ORDER — BUPIVACAINE HCL (PF) 0.25 % IJ SOLN
INTRAMUSCULAR | Status: DC | PRN
  Administered 2014-02-26: 14 mL

## 2014-02-26 MED ORDER — METOCLOPRAMIDE HCL 5 MG/ML IJ SOLN
INTRAMUSCULAR | Status: AC
  Filled 2014-02-26: qty 2

## 2014-02-26 MED ORDER — OXYCODONE-ACETAMINOPHEN 5-325 MG PO TABS: 1.0000 | ORAL_TABLET | Freq: Four times a day (QID) | ORAL | Status: DC | PRN

## 2014-02-26 MED ORDER — FENTANYL CITRATE 0.05 MG/ML IJ SOLN
INTRAMUSCULAR | Status: AC
  Filled 2014-02-26: qty 2

## 2014-02-26 MED ORDER — 0.9 % SODIUM CHLORIDE (POUR BTL) OPTIME
TOPICAL | Status: DC | PRN
  Administered 2014-02-26: 1000 mL

## 2014-02-26 MED ORDER — SODIUM CHLORIDE 0.9 % IJ SOLN
INTRAMUSCULAR | Status: DC | PRN
  Administered 2014-02-26: 10 mL

## 2014-02-26 MED ORDER — BUPIVACAINE HCL (PF) 0.25 % IJ SOLN
INTRAMUSCULAR | Status: AC
  Filled 2014-02-26: qty 30

## 2014-02-26 MED ORDER — PROPOFOL 10 MG/ML IV BOLUS
INTRAVENOUS | Status: DC | PRN
  Administered 2014-02-26: 200 mg via INTRAVENOUS

## 2014-02-26 MED ORDER — LACTATED RINGERS IV SOLN
INTRAVENOUS | Status: DC
  Administered 2014-02-26: 07:00:00 via INTRAVENOUS

## 2014-02-26 MED ORDER — METOCLOPRAMIDE HCL 5 MG/ML IJ SOLN
10.0000 mg | Freq: Once | INTRAMUSCULAR | Status: AC | PRN
  Administered 2014-02-26: 10 mg via INTRAVENOUS

## 2014-02-26 MED ORDER — HEPARIN SODIUM (PORCINE) 5000 UNIT/ML IJ SOLN
INTRAMUSCULAR | Status: AC
  Filled 2014-02-26: qty 1

## 2014-02-26 MED ORDER — PROPOFOL 10 MG/ML IV EMUL
INTRAVENOUS | Status: AC
  Filled 2014-02-26: qty 20

## 2014-02-26 MED ORDER — FENTANYL CITRATE 0.05 MG/ML IJ SOLN: 25.0000 ug | INTRAMUSCULAR | Status: DC | PRN

## 2014-02-26 MED ORDER — ROCURONIUM BROMIDE 100 MG/10ML IV SOLN
INTRAVENOUS | Status: AC
  Filled 2014-02-26: qty 1

## 2014-02-26 MED ORDER — SODIUM CHLORIDE 0.9 % IJ SOLN
INTRAMUSCULAR | Status: AC
  Filled 2014-02-26: qty 10

## 2014-02-26 MED ORDER — MEPERIDINE HCL 25 MG/ML IJ SOLN: 6.2500 mg | INTRAMUSCULAR | Status: DC | PRN

## 2014-02-26 MED ORDER — KETOROLAC TROMETHAMINE 30 MG/ML IJ SOLN: 15.0000 mg | Freq: Once | INTRAMUSCULAR | Status: DC | PRN

## 2014-02-26 MED ORDER — LIDOCAINE HCL (CARDIAC) 20 MG/ML IV SOLN
INTRAVENOUS | Status: AC
  Filled 2014-02-26: qty 5

## 2014-02-26 MED ORDER — ROCURONIUM BROMIDE 100 MG/10ML IV SOLN
INTRAVENOUS | Status: DC | PRN
  Administered 2014-02-26: 40 mg via INTRAVENOUS
  Administered 2014-02-26: 20 mg via INTRAVENOUS

## 2014-02-26 MED ORDER — KETOROLAC TROMETHAMINE 30 MG/ML IJ SOLN
INTRAMUSCULAR | Status: AC
  Filled 2014-02-26: qty 1

## 2014-02-26 MED ORDER — ONDANSETRON HCL 4 MG/2ML IJ SOLN
INTRAMUSCULAR | Status: AC
  Filled 2014-02-26: qty 2

## 2014-02-26 MED ORDER — FENTANYL CITRATE 0.05 MG/ML IJ SOLN
INTRAMUSCULAR | Status: AC
  Filled 2014-02-26: qty 5

## 2014-02-26 MED ORDER — FENTANYL CITRATE 0.05 MG/ML IJ SOLN
INTRAMUSCULAR | Status: DC | PRN
  Administered 2014-02-26: 100 ug via INTRAVENOUS
  Administered 2014-02-26: 50 ug via INTRAVENOUS
  Administered 2014-02-26: 100 ug via INTRAVENOUS
  Administered 2014-02-26: 50 ug via INTRAVENOUS

## 2014-02-26 MED ORDER — LACTATED RINGERS IV SOLN
INTRAVENOUS | Status: DC
  Administered 2014-02-26 (×2): via INTRAVENOUS

## 2014-02-26 MED ORDER — NEOSTIGMINE METHYLSULFATE 10 MG/10ML IV SOLN
INTRAVENOUS | Status: DC | PRN
  Administered 2014-02-26: 4 mg via INTRAVENOUS

## 2014-02-26 MED ORDER — LIDOCAINE HCL (CARDIAC) 20 MG/ML IV SOLN
INTRAVENOUS | Status: DC | PRN
  Administered 2014-02-26: 100 mg via INTRAVENOUS

## 2014-02-26 SURGICAL SUPPLY — 35 items
BNDG COHESIVE 3X5 WHT NS (GAUZE/BANDAGES/DRESSINGS) ×9 IMPLANT
CABLE HIGH FREQUENCY MONO STRZ (ELECTRODE) IMPLANT
CATH ROBINSON RED A/P 16FR (CATHETERS) ×3 IMPLANT
CHLORAPREP W/TINT 26ML (MISCELLANEOUS) ×3 IMPLANT
CLOTH BEACON ORANGE TIMEOUT ST (SAFETY) ×3 IMPLANT
DERMABOND ADVANCED (GAUZE/BANDAGES/DRESSINGS) ×2
DERMABOND ADVANCED .7 DNX12 (GAUZE/BANDAGES/DRESSINGS) ×1 IMPLANT
DRSG COVADERM PLUS 2X2 (GAUZE/BANDAGES/DRESSINGS) ×3 IMPLANT
DRSG OPSITE POSTOP 3X4 (GAUZE/BANDAGES/DRESSINGS) IMPLANT
GLOVE BIO SURGEON STRL SZ 6.5 (GLOVE) ×2 IMPLANT
GLOVE BIO SURGEON STRL SZ7 (GLOVE) ×3 IMPLANT
GLOVE BIO SURGEONS STRL SZ 6.5 (GLOVE) ×1
GLOVE BIOGEL PI IND STRL 7.0 (GLOVE) ×2 IMPLANT
GLOVE BIOGEL PI INDICATOR 7.0 (GLOVE) ×4
GLOVE SURG SS PI 8.5 STRL IVOR (GLOVE) ×4
GLOVE SURG SS PI 8.5 STRL STRW (GLOVE) ×2 IMPLANT
GOWN STRL REUS W/ TWL XL LVL3 (GOWN DISPOSABLE) ×1 IMPLANT
GOWN STRL REUS W/TWL LRG LVL3 (GOWN DISPOSABLE) ×3 IMPLANT
GOWN STRL REUS W/TWL XL LVL3 (GOWN DISPOSABLE) ×2
NEEDLE INSUFFLATION 120MM (ENDOMECHANICALS) ×3 IMPLANT
NS IRRIG 1000ML POUR BTL (IV SOLUTION) ×3 IMPLANT
PACK LAPAROSCOPY BASIN (CUSTOM PROCEDURE TRAY) ×3 IMPLANT
POUCH SPECIMEN RETRIEVAL 10MM (ENDOMECHANICALS) ×3 IMPLANT
PROTECTOR NERVE ULNAR (MISCELLANEOUS) ×3 IMPLANT
SET IRRIG TUBING LAPAROSCOPIC (IRRIGATION / IRRIGATOR) IMPLANT
SHEARS HARMONIC ACE PLUS 36CM (ENDOMECHANICALS) IMPLANT
SUT VIC AB 3-0 SH 27 (SUTURE) ×2
SUT VIC AB 3-0 SH 27XBRD (SUTURE) ×1 IMPLANT
SUT VICRYL 0 UR6 27IN ABS (SUTURE) ×3 IMPLANT
SUT VICRYL 4-0 PS2 18IN ABS (SUTURE) ×3 IMPLANT
TOWEL OR 17X24 6PK STRL BLUE (TOWEL DISPOSABLE) ×6 IMPLANT
TROCAR XCEL NON-BLD 11X100MML (ENDOMECHANICALS) ×3 IMPLANT
TROCAR XCEL NON-BLD 5MMX100MML (ENDOMECHANICALS) ×6 IMPLANT
WARMER LAPAROSCOPE (MISCELLANEOUS) ×3 IMPLANT
WATER STERILE IRR 1000ML POUR (IV SOLUTION) IMPLANT

## 2014-02-26 NOTE — Brief Op Note (Signed)
02/26/2014  8:26 AM  PATIENT:  Elizabeth Key  38 y.o. female  PRE-OPERATIVE DIAGNOSIS:  Sterilization,   POST-OPERATIVE DIAGNOSIS:  Sterilization,   PROCEDURE:  Procedure(s): LAPAROSCOPIC BILATERAL SALPINGECTOMY (Bilateral)  SURGEON:  Surgeon(s) and Role:    * Janyth Contes, MD - Primary  ANESTHESIA:   local and general  EBL:  Total I/O In: 1200 [I.V.:1200] Out: 100 [Urine:100]  BLOOD ADMINISTERED:none  DRAINS: none   LOCAL MEDICATIONS USED:  MARCAINE    and Amount: 14cc ml  SPECIMEN:  Source of Specimen:  B tubal segments  DISPOSITION OF SPECIMEN:  PATHOLOGY  COUNTS:  YES  TOURNIQUET:  * No tourniquets in log *  DICTATION: .Other Dictation: Dictation Number [id number]  PLAN OF CARE: Discharge to home after PACU  PATIENT DISPOSITION:  PACU - hemodynamically stable.   Delay start of Pharmacological VTE agent (>24hrs) due to surgical blood loss or risk of bleeding: not applicable

## 2014-02-26 NOTE — Op Note (Signed)
NAMEREIGHAN, HIPOLITO                 ACCOUNT NO.:  [id number]  MEDICAL RECORD NO.:  [id number]  LOCATION:  [facility]                          FACILITY:  [facility]  PHYSICIAN:  Thornell Sartorius, MD        DATE OF BIRTH:  [birth year]  DATE OF PROCEDURE:  02/26/2014 DATE OF DISCHARGE:                              OPERATIVE REPORT   PREOPERATIVE DIAGNOSIS:  Undesired fertility.  POSTOPERATIVE DIAGNOSIS:  Undesired fertility.  PROCEDURE:  Laparoscopic bilateral tubal ligation by bilateral salpingectomy.  SURGEON:  Thornell Sartorius, MD  ANESTHESIA:  Local 14 mL of Marcaine and general anesthesia.  IV FLUIDS:  1200 mL.  URINE OUTPUT:  100 mL by I and O cath prior to procedure.  ESTIMATED BLOOD LOSS:  Minimal.  PATHOLOGY:  Bilateral tubal segments to pathology.  DESCRIPTION OF PROCEDURE:  After informed consent was reviewed with the patient and her significant other including risks, benefits, and alternatives of surgery including but not limited to bleeding, infection, damage to surrounding organs as well as ectopic pregnancy risk and she was transported to the OR, placed on the table in supine position.  General anesthesia was induced, found be adequate.  She was then placed in the [place] stirrups, prepped and draped in the normal sterile fashion.  A open-sided speculum was used to easily visualize the cervix, which was grasped with single-tooth tenaculum and the Hulka manipulator was placed.  The gloves were changed.  Attention was turned to the abdominal portion of the case and approximately 2 cm vertical infraumbilical incision was made with a Veress needle.  Pneumoperitoneum was obtained after the hanging drop test was passed.  The trocar was placed under direct visualization.  Accessory ports were placed on both the right and left.  The umbilical and right accessory port of 5 mm, a 10-mm port was placed on the left under direct visualization.  A brief pelvic survey revealed somewhat boggy  uterus as well as normal tubes and ovaries bilaterally.  Initially, the right tube was followed out to the fimbriated end.  Then, using Harmonic Scalpel, removed and it was placed in the anterior cul-de-sac to hold.  The left was also easily identified, followed out to the fimbriated end and ligated up to the distal 2/3rd, also placed in the anterior cul-de-sac using an EndoCatch bag.  Both segments were placed in an EndoCatch bag and it was easily removed from the 10 mm port.  The gas was evacuated from the abdomen, it was noted to be hemostatic.  The trocars were removed under direct visualization.  Each trocar site was closed.  A 10-mm port had a deep suture of 0 Vicryl.  A subcuticular 3-0 Vicryl was used on all 3 as well as Dermabond.  Sponge, lap, and needle counts were correct x2 per the operating staff.     Thornell Sartorius, MD     JB/MEDQ  D:  02/26/2014  T:  02/26/2014  Job:  [id number]

## 2014-02-26 NOTE — Discharge Instructions (Signed)

## 2014-02-26 NOTE — Anesthesia Procedure Notes (Addendum)
Date/Time: 02/26/2014 7:42 AM Performed by: Genevie Ann   Procedure Name: Intubation Date/Time: 02/26/2014 7:31 AM Performed by: Jaliah Foody, Sheron Nightingale Pre-anesthesia Checklist: Patient identified, Patient being monitored, Emergency Drugs available, Timeout performed and Suction available Patient Re-evaluated:Patient Re-evaluated prior to inductionOxygen Delivery Method: Circle system utilized Preoxygenation: Pre-oxygenation with 100% oxygen Intubation Type: IV induction Ventilation: Mask ventilation without difficulty Laryngoscope Size: Mac and 3 Grade View: Grade I Tube size: 7.0 mm Number of attempts: 1 Placement Confirmation: ETT inserted through vocal cords under direct vision,  breath sounds checked- equal and bilateral and positive ETCO2 Secured at: 21 cm Dental Injury: Teeth and Oropharynx as per pre-operative assessment

## 2014-02-26 NOTE — Anesthesia Preprocedure Evaluation (Signed)
Anesthesia Evaluation  Patient identified by MRN, date of birth, ID band Patient awake    Reviewed: Allergy & Precautions, H&P , NPO status , Patient's Chart, lab work & pertinent test results, reviewed documented beta blocker date and time   History of Anesthesia Complications Negative for: history of anesthetic complications  Airway Mallampati: III TM Distance: >3 FB Neck ROM: full    Dental  (+) Teeth Intact   Pulmonary neg pulmonary ROS,  breath sounds clear to auscultation  Pulmonary exam normal       Cardiovascular Rhythm:regular Rate:Normal     Neuro/Psych  Headaches (migraines - 3-4/month), Depression    GI/Hepatic negative GI ROS, Neg liver ROS,   Endo/Other  BMI 37.2  Renal/GU      Musculoskeletal   Abdominal   Peds  Hematology negative hematology ROS (+)   Anesthesia Other Findings 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   Dental Advisory Given  Plan Discussed with: CRNA and Surgeon  Anesthesia Plan Comments:         Anesthesia Quick Evaluation

## 2014-02-26 NOTE — Transfer of Care (Signed)
Immediate Anesthesia Transfer of Care Note  Patient: Elizabeth Key  Procedure(s) Performed: Procedure(s): LAPAROSCOPIC BILATERAL SALPINGECTOMY (Bilateral)  Patient Location: PACU  Anesthesia Type:General  Level of Consciousness: awake, alert  and oriented  Airway & Oxygen Therapy: Patient Spontanous Breathing and Patient connected to nasal cannula oxygen  Post-op Assessment: Report given to PACU RN and Post -op Vital signs reviewed and stable  Post vital signs: Reviewed and stable  Complications: No apparent anesthesia complications

## 2014-02-26 NOTE — Interval H&P Note (Signed)
History and Physical Interval Note:  02/26/2014 7:15 AM  Elizabeth Key  has presented today for surgery, with the diagnosis of Sterilization,   The various methods of treatment have been discussed with the patient and family. After consideration of risks, benefits and other options for treatment, the patient has consented to  Procedure(s): LAPAROSCOPIC BILATERAL SALPINGECTOMY (Bilateral) as a surgical intervention .  The patient's history has been reviewed, patient examined, no change in status, stable for surgery.  I have reviewed the patient's chart and labs.  Questions were answered to the patient's satisfaction.     Bovard-Stuckert, Mikaella Escalona

## 2014-03-01 ENCOUNTER — Encounter (HOSPITAL_COMMUNITY): Payer: Self-pay | Admitting: Obstetrics and Gynecology

## 2015-05-17 ENCOUNTER — Ambulatory Visit: Payer: Managed Care, Other (non HMO) | Admitting: Podiatry

## 2015-05-24 ENCOUNTER — Ambulatory Visit: Payer: Commercial Managed Care - HMO | Admitting: Podiatry

## 2018-02-22 ENCOUNTER — Encounter (HOSPITAL_COMMUNITY): Payer: Self-pay | Admitting: Nurse Practitioner

## 2018-02-22 ENCOUNTER — Emergency Department (HOSPITAL_COMMUNITY)
Admission: EM | Admit: 2018-02-22 | Discharge: 2018-02-22 | Disposition: A | Discharge status: Home / Self Care | Payer: Managed Care, Other (non HMO) | Attending: Emergency Medicine | Admitting: Emergency Medicine

## 2018-02-22 ENCOUNTER — Emergency Department (HOSPITAL_COMMUNITY): Payer: Managed Care, Other (non HMO)

## 2018-02-22 ENCOUNTER — Emergency Department (HOSPITAL_BASED_OUTPATIENT_CLINIC_OR_DEPARTMENT_OTHER): Payer: Managed Care, Other (non HMO)

## 2018-02-22 DIAGNOSIS — Y999 Unspecified external cause status: Secondary | ICD-10-CM | POA: Insufficient documentation

## 2018-02-22 DIAGNOSIS — Y939 Activity, unspecified: Secondary | ICD-10-CM | POA: Insufficient documentation

## 2018-02-22 DIAGNOSIS — M79661 Pain in right lower leg: Secondary | ICD-10-CM | POA: Diagnosis present

## 2018-02-22 DIAGNOSIS — Y929 Unspecified place or not applicable: Secondary | ICD-10-CM | POA: Insufficient documentation

## 2018-02-22 DIAGNOSIS — S86911A Strain of unspecified muscle(s) and tendon(s) at lower leg level, right leg, initial encounter: Secondary | ICD-10-CM | POA: Diagnosis not present

## 2018-02-22 DIAGNOSIS — X58XXXA Exposure to other specified factors, initial encounter: Secondary | ICD-10-CM | POA: Insufficient documentation

## 2018-02-22 DIAGNOSIS — Z79899 Other long term (current) drug therapy: Secondary | ICD-10-CM | POA: Insufficient documentation

## 2018-02-22 DIAGNOSIS — M79609 Pain in unspecified limb: Secondary | ICD-10-CM | POA: Diagnosis not present

## 2018-02-22 NOTE — ED Triage Notes (Signed)
Pt is c/o right shin/tibia/calf pain with subjective swelling. States she has no hx of DVTs but she has had surgery for plantar fascitis on the leg, she is concerned that the pain was "sudden and progressive" for the last couple hours and worsens with touch.

## 2018-02-22 NOTE — ED Provider Notes (Signed)
[facility] DEPT Provider Note   CSN: [id number] Arrival date & time: 02/22/18  1533     History   Chief Complaint Chief Complaint  Patient presents with  . R/o DVTs  . Leg Pain    Right    HPI Sareen Mystiqu Walko is a 42 y.o. female.  HPI  42 year old female presents with right leg pain.  She has a prior history of plantar fasciitis that has been repaired.  This morning when she woke up she developed pain to the plantar aspect of her foot that felt similar.  She went to urgent care where they did an x-ray that was reportedly negative.  Treated her with steroids and pain medicine.  Told her to follow-up with podiatry.  However since being at home the foot pain is better but now she is having pain in her lower leg above the ankle.  This is anterior.  A little left-sided as well.  There has been no injury.  It feels worse when she is bearing weight on the foot.  She has a little bit of throbbing to her calf as well.  However it is not reproducible.  She does not notice any swelling.  No shortness of breath.  No travel or history of OCP use.  She does have a family history of blood clots.  Pain is mild at rest.  Past Medical History:  Diagnosis Date  . Admission for tubal ligation 02/25/2014  . GBTDVVOH(607.3)     Patient Active Problem List   Diagnosis Date Noted  . S/P tubal ligation 02/26/2014  . Admission for tubal ligation 02/25/2014    Past Surgical History:  Procedure Laterality Date  . BREAST REDUCTION SURGERY    . CESAREAN SECTION    . LAPAROSCOPIC BILATERAL SALPINGECTOMY Bilateral 02/26/2014   Procedure: LAPAROSCOPIC BILATERAL SALPINGECTOMY;  Surgeon: Janyth Contes, MD;  Location: [facility];  Service: Gynecology;  Laterality: Bilateral;     OB History   None      Home Medications    Prior to Admission medications   Medication Sig Start Date End Date Taking? Authorizing Provider  naproxen sodium (ALEVE) 220 MG tablet Take 440  mg by mouth daily as needed (headache).   Yes [provider]  predniSONE (STERAPRED UNI-PAK 48 TAB) 10 MG (48) TBPK tablet Take by mouth. 02/22/18  Yes [provider]  meloxicam (MOBIC) 15 MG tablet Take 1 tablet (15 mg total) by mouth daily. Patient not taking: Reported on 02/22/2018 06/09/13   Hyatt, Max T, DPM  methylPREDNISolone (MEDROL DOSEPAK) 4 MG TBPK tablet Take 4 mg by mouth See admin instructions. unknown    [provider]  naproxen (NAPRELAN) 500 MG 24 hr tablet Take 1 tablet (500 mg total) by mouth daily with breakfast. Patient not taking: Reported on 02/22/2018 02/26/14   Bovard-Stuckert, Jeral Fruit, MD  nitrofurantoin, macrocrystal-monohydrate, (MACROBID) 100 MG capsule Take 100 mg by mouth See admin instructions. Unknown    [provider]  norgestimate-ethinyl estradiol ([place] 28) 0.25-35 MG-MCG tablet Take 1 tablet by mouth daily.    [provider]  oxyCODONE-acetaminophen (ROXICET) 5-325 MG per tablet Take 1-2 tablets by mouth every 6 (six) hours as needed for severe pain. Patient not taking: Reported on 02/22/2018 02/26/14   Bovard-Stuckert, Jeral Fruit, MD  traMADol (ULTRAM) 50 MG tablet Take 50 mg by mouth every 6 (six) hours as needed for pain. 02/22/18 02/22/19  [provider]    Family History History reviewed. No pertinent family  history.  Social History Social History   Tobacco Use  . Smoking status: Never Smoker  Substance Use Topics  . Alcohol use: No  . Drug use: No     Allergies   Shellfish allergy   Review of Systems Review of Systems  Respiratory: Negative for shortness of breath.   Cardiovascular: Negative for leg swelling.  Musculoskeletal: Positive for arthralgias and myalgias.  Neurological: Negative for weakness and numbness.     Physical Exam Updated Vital Signs BP 132/70 (BP Location: Right Arm)   Pulse 90   Temp 98.1 F (36.7 C) (Oral)   Resp 16   SpO2 100%   Physical Exam    Constitutional: She is oriented to person, place, and time. She appears well-developed and well-nourished. No distress.  HENT:  Head: Normocephalic and atraumatic.  Right Ear: External ear normal.  Left Ear: External ear normal.  Nose: Nose normal.  Eyes: Right eye exhibits no discharge. Left eye exhibits no discharge.  Cardiovascular: Normal rate and regular rhythm.  Pulses:      Dorsalis pedis pulses are 2+ on the right side, and 2+ on the left side.  Pulmonary/Chest: Effort normal.  Musculoskeletal:       Right ankle: She exhibits normal range of motion and no swelling. No tenderness. Achilles tendon exhibits no defect.       Right lower leg: She exhibits tenderness and bony tenderness. She exhibits no swelling.       Legs:      Right foot: There is tenderness (plantar). There is no swelling.       Feet:  Neurological: She is alert and oriented to person, place, and time.  Skin: Skin is warm and dry. She is not diaphoretic.  Nursing note and vitals reviewed.    ED Treatments / Results  Labs (all labs ordered are listed, but only abnormal results are displayed) Labs Reviewed - No data to display  EKG None  Radiology Dg Tibia/fibula Right  Result Date: 02/22/2018 CLINICAL DATA:  Pt is c/o right inferior/medial lower leg pain onset this a.m. States pain started in right foot and has now radiated superiorly. EXAM: RIGHT TIBIA AND FIBULA - 2 VIEW COMPARISON:  None. FINDINGS: No acute fracture or dislocation. No focal osseous lesion. No soft tissue swelling. No callus deposition or periosteal reaction. IMPRESSION: No acute osseous abnormality. Electronically Signed   By: Abigail Miyamoto M.D.   On: 02/22/2018 17:06   VASCULAR LAB PRELIMINARY  PRELIMINARY  PRELIMINARY  PRELIMINARY  Right lower extremity venous duplex completed.    Preliminary report:  There is no DVT or SVT noted in the right lower extremity.  Gave report to Dr. Regenia Skeeter.  KANADY, CANDACE, RVT 02/22/2018,  6:44 PM    Procedures Procedures (including critical care time)  Medications Ordered in ED Medications - No data to display   Initial Impression / Assessment and Plan / ED Course  I have reviewed the triage vital signs and the nursing notes.  Pertinent labs & imaging results that were available during my care of the patient were reviewed by me and considered in my medical decision making (see chart for details).     X-ray and DVT ultrasound are both negative.  This is probably muscular.  I suspect since she has been having the right foot pain she has been walking abnormally, causing stress on her lower leg muscles.  No decreased range of motion of her foot/ankle.  She probably does have recurrent plantar fasciitis.  Follow-up  with her podiatrist as scheduled. NV intact  Final Clinical Impressions(s) / ED Diagnoses   Final diagnoses:  Muscle strain of right lower leg, initial encounter    ED Discharge Orders    None       Sherwood Gambler, MD 02/22/18 2220

## 2018-02-22 NOTE — Progress Notes (Signed)
VASCULAR LAB PRELIMINARY  PRELIMINARY  PRELIMINARY  PRELIMINARY  Right lower extremity venous duplex completed.    Preliminary report:  There is no DVT or SVT noted in the right lower extremity.  Gave report to Dr. Regenia Skeeter.  Larenzo Caples, RVT 02/22/2018, 6:44 PM

## 2018-02-25 ENCOUNTER — Encounter: Payer: Self-pay | Admitting: Podiatry

## 2018-02-25 ENCOUNTER — Ambulatory Visit: Payer: Self-pay

## 2018-02-25 ENCOUNTER — Ambulatory Visit: Payer: Commercial Managed Care - HMO | Admitting: Podiatry

## 2018-02-25 VITALS — BP 122/73 | HR 66 | Resp 16

## 2018-02-25 DIAGNOSIS — N63 Unspecified lump in unspecified breast: Secondary | ICD-10-CM | POA: Insufficient documentation

## 2018-02-25 DIAGNOSIS — M722 Plantar fascial fibromatosis: Secondary | ICD-10-CM | POA: Insufficient documentation

## 2018-02-25 DIAGNOSIS — N920 Excessive and frequent menstruation with regular cycle: Secondary | ICD-10-CM | POA: Insufficient documentation

## 2018-02-25 DIAGNOSIS — N946 Dysmenorrhea, unspecified: Secondary | ICD-10-CM | POA: Insufficient documentation

## 2018-02-25 DIAGNOSIS — A749 Chlamydial infection, unspecified: Secondary | ICD-10-CM | POA: Insufficient documentation

## 2018-02-25 DIAGNOSIS — M779 Enthesopathy, unspecified: Principal | ICD-10-CM

## 2018-02-25 DIAGNOSIS — M778 Other enthesopathies, not elsewhere classified: Secondary | ICD-10-CM

## 2018-02-25 MED ORDER — MELOXICAM 15 MG PO TABS: 15.0000 mg | ORAL_TABLET | Freq: Every day | ORAL | 3 refills | Status: DC

## 2018-02-25 NOTE — Patient Instructions (Signed)

## 2018-02-26 NOTE — Progress Notes (Signed)
Subjective:  Patient ID: Elizabeth Key, female    DOB: [birth year],  MRN: [id number] HPI Chief Complaint  Patient presents with  . Foot Pain    Plantar heel right - sudden, sharp pain x 3 days ago, started while putting pants, went to [facility] - xrayed said had PF, Rx'd pred pack, noticed when she got home she was having calf pain so went to ER - they also xrayed and said PF and to take pred pack rx'd-started that yesterday  . New Patient (Initial Visit)    Est pt 68    42 y.o. female presents with the above complaint.   ROS: Denies fever chills nausea vomiting muscle aches pains calf pain back pain chest pain shortness of breath.  Past Medical History:  Diagnosis Date  . Admission for tubal ligation 02/25/2014  . ZWCHENID(782.4)    Past Surgical History:  Procedure Laterality Date  . BREAST REDUCTION SURGERY    . CESAREAN SECTION    . LAPAROSCOPIC BILATERAL SALPINGECTOMY Bilateral 02/26/2014   Procedure: LAPAROSCOPIC BILATERAL SALPINGECTOMY;  Surgeon: Janyth Contes, MD;  Location: [facility];  Service: Gynecology;  Laterality: Bilateral;    Current Outpatient Medications:  .  methylPREDNISolone (MEDROL DOSEPAK) 4 MG TBPK tablet, Take by mouth., Disp: , Rfl:  .  meloxicam (MOBIC) 15 MG tablet, Take 1 tablet (15 mg total) by mouth daily., Disp: 30 tablet, Rfl: 3 .  naproxen sodium (ALEVE) 220 MG tablet, Take 440 mg by mouth daily as needed (headache)., Disp: , Rfl:  .  norgestimate-ethinyl estradiol (SPRINTEC 28) 0.25-35 MG-MCG tablet, Take 1 tablet by mouth daily., Disp: , Rfl:  .  traMADol (ULTRAM) 50 MG tablet, Take 50 mg by mouth every 6 (six) hours as needed for pain., Disp: , Rfl:   Allergies  Allergen Reactions  . Shellfish Allergy Anaphylaxis    ALL SEAFOOD causes this reaction   Review of Systems Objective:   Vitals:   02/25/18 1050  BP: 122/73  Pulse: 66  Resp: 16    General: Well developed, nourished, in no acute distress, alert and oriented x3    Dermatological: Skin is warm, dry and supple bilateral. Nails x 10 are well maintained; remaining integument appears unremarkable at this time. There are no open sores, no preulcerative lesions, no rash or signs of infection present.  Vascular: Dorsalis Pedis artery and Posterior Tibial artery pedal pulses are 2/4 bilateral with immedate capillary fill time. Pedal hair growth present. No varicosities and no lower extremity edema present bilateral.   Neruologic: Grossly intact via light touch bilateral. Vibratory intact via tuning fork bilateral. Protective threshold with Semmes Wienstein monofilament intact to all pedal sites bilateral. Patellar and Achilles deep tendon reflexes 2+ bilateral. No Babinski or clonus noted bilateral.   Musculoskeletal: No gross boney pedal deformities bilateral. No pain, crepitus, or limitation noted with foot and ankle range of motion bilateral. Muscular strength 5/5 in all groups tested bilateral.  She has pain on palpation medial calcaneal tubercle of the right heel though she does have some tenderness on medial lateral compression of the calcaneus.  Questionable for possible stress fracture of the calcaneus.  But on radiographs taken just recently I do not see any type of stress reaction.  Gait: Unassisted, Nonantalgic.    Radiographs:  Radiographs demonstrate soft tissue increase in density plantar calcaneal insertion site of the right heel.  These were taken in the ER.  No fractures visualized.  Assessment & Plan:   Assessment: At this point we have  to consider plantar fasciitis is the primary cause of her symptoms.  Plan: Recommended that she go ahead and get started on the Medrol Dosepak that she was provided I will also re-issue the meloxicam.  I injected the right heel today after sterile Betadine skin prep 20 mg Kenalog 5 mg Marcaine point of maximal tenderness of the right heel.  Tolerated procedure well without comp occasions.     Max T. [place],  [place]

## 2018-03-18 ENCOUNTER — Encounter (HOSPITAL_COMMUNITY): Payer: Self-pay

## 2018-03-18 NOTE — Patient Instructions (Signed)
Your procedure is scheduled on:  Tuesday, Aug. 13, 2019  Report to [facility] AT  6:00 A. M.   Call this number if you have problems the morning of surgery:  [phone].   OUR ADDRESS IS [address].  WE ARE LOCATED IN THE [place]                                   [facility].                                     REMEMBER:  DO NOT EAT FOOD OR DRINK LIQUIDS AFTER MIDNIGHT .    TAKE THESE MEDICATIONS MORNING OF SURGERY WITH A SIP OF WATER:  NONE  DO NOT WEAR JEWERLY, MAKE UP, OR NAIL POLISH.  DO NOT WEAR LOTIONS, POWDERS, PERFUMES/COLOGNE OR DEODORANT.  DO NOT SHAVE FOR 24 HOURS PRIOR TO DAY OF SURGERY.  CONTACTS, GLASSES, OR DENTURES MAY NOT BE WORN TO SURGERY.                                    [facility] IS NOT RESPONSIBLE  FOR ANY BELONGINGS.                                                                      [facility] Before surgery, you can play an important role.  Because skin is not sterile, your skin needs to be as free of germs as possible.  You can reduce the number of germs on your skin by washing with CHG (chlorahexidine gluconate) soap before surgery.  CHG is an antiseptic cleaner which kills germs and bonds with the skin to continue killing germs even after washing. Please DO NOT use if you have an allergy to CHG or antibacterial soaps.  If your skin becomes reddened/irritated stop using the CHG and inform your nurse when you arrive at Short Stay. Do not shave (including legs and underarms) for at least 48 hours prior to the first CHG shower.  You may shave your face/neck.  Please follow these instructions carefully:  1.  Shower with CHG Soap the night before surgery and the  morning of surgery.  2.  If you choose to wash your hair, wash your hair first as usual with your normal  shampoo.  3.  After you shampoo, rinse your hair and body thoroughly to remove the shampoo.                             4.  Use CHG  as you would any other liquid soap.  You can apply chg directly to the skin and wash.  Gently with a scrungie or clean washcloth.  5.  Apply the CHG Soap to your body ONLY FROM THE NECK DOWN.   Do   not use on face/ open  Wound or open sores. Avoid contact with eyes, ears mouth and   genitals (private parts).                       Wash face,  Genitals (private parts) with your normal soap.             6.  Wash thoroughly, paying special attention to the area where your    surgery  will be performed.  7.  Thoroughly rinse your body with warm water from the neck down.  8.  DO NOT shower/wash with your normal soap after using and rinsing off the CHG Soap.                9.  Pat yourself dry with a clean towel.            10.  Wear clean pajamas.            11.  Place clean sheets on your bed the night of your first shower and do not  sleep with pets. Day of Surgery : Do not apply any lotions/deodorants the morning of surgery.  Please wear clean clothes to the hospital/surgery center.  FAILURE TO FOLLOW THESE INSTRUCTIONS MAY RESULT IN THE CANCELLATION OF YOUR SURGERY  PATIENT SIGNATURE_________________________________  NURSE SIGNATURE__________________________________  ________________________________________________________________________   Adam Phenix  An incentive spirometer is a tool that can help keep your lungs clear and active. This tool measures how well you are filling your lungs with each breath. Taking long deep breaths may help reverse or decrease the chance of developing breathing (pulmonary) problems (especially infection) following:  A long period of time when you are unable to move or be active. BEFORE THE PROCEDURE   If the spirometer includes an indicator to show your best effort, your nurse or respiratory therapist will set it to a desired goal.  If possible, sit up straight or lean slightly forward. Try not to slouch.  Hold the  incentive spirometer in an upright position. INSTRUCTIONS FOR USE  1. Sit on the edge of your bed if possible, or sit up as far as you can in bed or on a chair. 2. Hold the incentive spirometer in an upright position. 3. Breathe out normally. 4. Place the mouthpiece in your mouth and seal your lips tightly around it. 5. Breathe in slowly and as deeply as possible, raising the piston or the ball toward the top of the column. 6. Hold your breath for 3-5 seconds or for as long as possible. Allow the piston or ball to fall to the bottom of the column. 7. Remove the mouthpiece from your mouth and breathe out normally. 8. Rest for a few seconds and repeat Steps 1 through 7 at least 10 times every 1-2 hours when you are awake. Take your time and take a few normal breaths between deep breaths. 9. The spirometer may include an indicator to show your best effort. Use the indicator as a goal to work toward during each repetition. 10. After each set of 10 deep breaths, practice coughing to be sure your lungs are clear. If you have an incision (the cut made at the time of surgery), support your incision when coughing by placing a pillow or rolled up towels firmly against it. Once you are able to get out of bed, walk around indoors and cough well. You may stop using the incentive spirometer when instructed by your caregiver.  RISKS AND COMPLICATIONS  Take your time  so you do not get dizzy or light-headed.  If you are in pain, you may need to take or ask for pain medication before doing incentive spirometry. It is harder to take a deep breath if you are having pain. AFTER USE  Rest and breathe slowly and easily.  It can be helpful to keep track of a log of your progress. Your caregiver can provide you with a simple table to help with this. If you are using the spirometer at home, follow these instructions: [place] IF:   You are having difficultly using the spirometer.  You have trouble using  the spirometer as often as instructed.  Your pain medication is not giving enough relief while using the spirometer.  You develop fever of 100.5 F (38.1 C) or higher. SEEK IMMEDIATE MEDICAL CARE IF:   You cough up bloody sputum that had not been present before.  You develop fever of 102 F (38.9 C) or greater.  You develop worsening pain at or near the incision site. MAKE SURE YOU:   Understand these instructions.  Will watch your condition.  Will get help right away if you are not doing well or get worse. Document Released: 12/10/2006 Document Revised: 10/22/2011 Document Reviewed: 02/10/2007 ExitCare Patient Information 2014 ExitCare, [place].   ________________________________________________________________________  WHAT IS A BLOOD TRANSFUSION? Blood Transfusion Information  A transfusion is the replacement of blood or some of its parts. Blood is made up of multiple cells which provide different functions.  Red blood cells carry oxygen and are used for blood loss replacement.  White blood cells fight against infection.  Platelets control bleeding.  Plasma helps clot blood.  Other blood products are available for specialized needs, such as hemophilia or other clotting disorders. BEFORE THE TRANSFUSION  Who gives blood for transfusions?   Healthy volunteers who are fully evaluated to make sure their blood is safe. This is blood bank blood. Transfusion therapy is the safest it has ever been in the practice of medicine. Before blood is taken from a donor, a complete history is taken to make sure that person has no history of diseases nor engages in risky social behavior (examples are intravenous drug use or sexual activity with multiple partners). The donor's travel history is screened to minimize risk of transmitting infections, such as malaria. The donated blood is tested for signs of infectious diseases, such as HIV and hepatitis. The blood is then tested to be sure it  is compatible with you in order to minimize the chance of a transfusion reaction. If you or a relative donates blood, this is often done in anticipation of surgery and is not appropriate for emergency situations. It takes many days to process the donated blood. RISKS AND COMPLICATIONS Although transfusion therapy is very safe and saves many lives, the main dangers of transfusion include:   Getting an infectious disease.  Developing a transfusion reaction. This is an allergic reaction to something in the blood you were given. Every precaution is taken to prevent this. The decision to have a blood transfusion has been considered carefully by your caregiver before blood is given. Blood is not given unless the benefits outweigh the risks. AFTER THE TRANSFUSION  Right after receiving a blood transfusion, you will usually feel much better and more energetic. This is especially true if your red blood cells have gotten low (anemic). The transfusion raises the level of the red blood cells which carry oxygen, and this usually causes an energy increase.  The  nurse administering the transfusion will monitor you carefully for complications. HOME CARE INSTRUCTIONS  No special instructions are needed after a transfusion. You may find your energy is better. Speak with your caregiver about any limitations on activity for underlying diseases you may have. SEEK MEDICAL CARE IF:   Your condition is not improving after your transfusion.  You develop redness or irritation at the intravenous (IV) site. SEEK IMMEDIATE MEDICAL CARE IF:  Any of the following symptoms occur over the next 12 hours:  Shaking chills.  You have a temperature by mouth above 102 F (38.9 C), not controlled by medicine.  Chest, back, or muscle pain.  People around you feel you are not acting correctly or are confused.  Shortness of breath or difficulty breathing.  Dizziness and fainting.  You get a rash or develop hives.  You  have a decrease in urine output.  Your urine turns a dark color or changes to pink, red, or brown. Any of the following symptoms occur over the next 10 days:  You have a temperature by mouth above 102 F (38.9 C), not controlled by medicine.  Shortness of breath.  Weakness after normal activity.  The white part of the eye turns yellow (jaundice).  You have a decrease in the amount of urine or are urinating less often.  Your urine turns a dark color or changes to pink, red, or brown. Document Released: 07/27/2000 Document Revised: 10/22/2011 Document Reviewed: 03/15/2008 [facility] Patient Information 2014 [place], [place].  _______________________________________________________________________

## 2018-03-19 ENCOUNTER — Encounter (HOSPITAL_COMMUNITY)
Admission: RE | Admit: 2018-03-19 | Discharge: 2018-03-19 | Disposition: A | Discharge status: Home / Self Care | Payer: Managed Care, Other (non HMO) | Source: Ambulatory Visit | Attending: Obstetrics and Gynecology | Admitting: Obstetrics and Gynecology

## 2018-03-19 ENCOUNTER — Encounter (HOSPITAL_COMMUNITY): Payer: Self-pay

## 2018-03-19 ENCOUNTER — Other Ambulatory Visit: Payer: Self-pay

## 2018-03-19 DIAGNOSIS — Z01812 Encounter for preprocedural laboratory examination: Secondary | ICD-10-CM | POA: Insufficient documentation

## 2018-03-19 HISTORY — DX: Nausea with vomiting, unspecified: Z98.890

## 2018-03-19 HISTORY — DX: Vitamin D deficiency, unspecified: E55.9

## 2018-03-19 HISTORY — DX: Nausea with vomiting, unspecified: R11.2

## 2018-03-19 HISTORY — DX: Migraine, unspecified, not intractable, without status migrainosus: G43.909

## 2018-03-19 HISTORY — DX: Plantar fascial fibromatosis: M72.2

## 2018-03-19 HISTORY — DX: Herpesviral infection, unspecified: B00.9

## 2018-03-19 HISTORY — DX: Major depressive disorder, single episode, unspecified: F32.9

## 2018-03-19 HISTORY — DX: Tension-type headache, unspecified, not intractable: G44.209

## 2018-03-19 LAB — CBC
HCT: 34.9 % — ABNORMAL LOW (ref 36.0–46.0)
Hemoglobin: 11.8 g/dL — ABNORMAL LOW (ref 12.0–15.0)
MCH: 25.7 pg — ABNORMAL LOW (ref 26.0–34.0)
MCHC: 33.8 g/dL (ref 30.0–36.0)
MCV: 75.9 fL — AB (ref 78.0–100.0)
PLATELETS: 366 10*3/uL (ref 150–400)
RBC: 4.6 MIL/uL (ref 3.87–5.11)
RDW: 14.4 % (ref 11.5–15.5)
WBC: 6.4 10*3/uL (ref 4.0–10.5)

## 2018-03-19 LAB — COMPREHENSIVE METABOLIC PANEL
ALT: 17 U/L (ref 0–44)
AST: 19 U/L (ref 15–41)
Albumin: 3.9 g/dL (ref 3.5–5.0)
Alkaline Phosphatase: 57 U/L (ref 38–126)
Anion gap: 10 (ref 5–15)
BUN: 10 mg/dL (ref 6–20)
CHLORIDE: 107 mmol/L (ref 98–111)
CO2: 24 mmol/L (ref 22–32)
Calcium: 9.3 mg/dL (ref 8.9–10.3)
Creatinine, Ser: 0.9 mg/dL (ref 0.44–1.00)
GFR calc Af Amer: >60 mL/min (ref >60)
Glucose, Bld: 94 mg/dL (ref 70–99)
POTASSIUM: 4 mmol/L (ref 3.5–5.1)
SODIUM: 141 mmol/L (ref 135–145)
Total Bilirubin: 0.8 mg/dL (ref 0.3–1.2)
Total Protein: 7 g/dL (ref 6.5–8.1)

## 2018-03-19 LAB — ABO/RH: ABO/RH(D): O POS

## 2018-03-19 NOTE — Pre-Procedure Instructions (Signed)
CBC results 03/19/18 faxed to Dr. Sandford Craze.

## 2018-03-24 NOTE — H&P (Signed)
Elizabeth Key is an 42 y.o. female with menorrhagia and dymenorrhea who desires definitive management.  US reveals fibroids (intramural and subserosal).  Benign EMB.  History of ironndefieciency.  D/W pt r/b/a of LTCS as well as other options, ablation - desires definitive management,  Pertinent Gynecological History: OB History: G3, P2012 G1 LTCS - arrest of descent/dilation, fetal stress G2 rLTCS G3 TAB  No abn pap - last 12/10/16 - HR HPV neg STD - HSV 1 and 2  BTL by salpingectomy  Fibroids  Menstrual History:  Patient's last menstrual period was 03/10/2018 (exact date).    Past Medical History:  Diagnosis Date  . Admission for tubal ligation 02/25/2014  . HSV-1 (herpes simplex virus 1) infection   . HSV-2 (herpes simplex virus 2) infection   . Major depression   . Migraines   . Plantar fasciitis of right foot   . PONV (postoperative nausea and vomiting)    low oxygen saturation in recovery  . Tension headache   . Vitamin D deficiency     Past Surgical History:  Procedure Laterality Date  . BREAST REDUCTION SURGERY    . CESAREAN SECTION    . LAPAROSCOPIC BILATERAL SALPINGECTOMY Bilateral 02/26/2014   Procedure: LAPAROSCOPIC BILATERAL SALPINGECTOMY;  Surgeon: Janyth Contes, MD;  Location: [facility];  Service: Gynecology;  Laterality: Bilateral;  LTCS x2; plantar fascitits  FH: Alzheimer's, HTN, DM, CAD, prostate CA, lung CA  Social History:  reports that she has never smoked. She has never used smokeless tobacco. She reports that she does not drink alcohol or use drugs. 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 Shellfish Allergy Anaphylaxis    ALL SEAFOOD causes this reaction  NKDA, no latex allergy  Meds: sumatriptal, tramadol  Review of Systems  Constitutional: Negative.   HENT: Negative.   Eyes: Negative.   Respiratory: Negative.   Cardiovascular: Negative.   Gastrointestinal: Negative.   Genitourinary: Negative.    Musculoskeletal: Negative.   Skin: Negative.   Neurological: Negative.   Psychiatric/Behavioral: Negative.     Last menstrual period 03/10/2018. Physical Exam  Constitutional: She is oriented to person, place, and time. She appears well-developed and well-nourished.  HENT:  Head: Normocephalic and atraumatic.  Cardiovascular: Normal rate and regular rhythm.  Respiratory: Effort normal and breath sounds normal. No respiratory distress. She has no wheezes.  GI: Soft. Bowel sounds are normal. She exhibits no distension. There is no tenderness.  Musculoskeletal: Normal range of motion.  Neurological: She is alert and oriented to person, place, and time.  Skin: Skin is warm and dry.  Psychiatric: She has a normal mood and affect. Her behavior is normal.    [place] - multifibroids (subserosal and intramural), nl ovaries  Assessment/Plan: 42yo [id number] for LAVH given menorrhagia and dysmenorrhea D/w pt r/b/a of surgery Ancef for prohpylaxis PCA post op  Da Michelle Bovard-Stuckert 03/24/2018, 9:50 PM

## 2018-03-25 ENCOUNTER — Ambulatory Visit (HOSPITAL_BASED_OUTPATIENT_CLINIC_OR_DEPARTMENT_OTHER): Payer: Managed Care, Other (non HMO) | Admitting: Anesthesiology

## 2018-03-25 ENCOUNTER — Encounter (HOSPITAL_BASED_OUTPATIENT_CLINIC_OR_DEPARTMENT_OTHER)
Admission: RE | Disposition: A | Discharge status: Home / Self Care | Payer: Self-pay | Source: Ambulatory Visit | Attending: Obstetrics and Gynecology

## 2018-03-25 ENCOUNTER — Observation Stay (HOSPITAL_BASED_OUTPATIENT_CLINIC_OR_DEPARTMENT_OTHER)
Admission: RE | Admit: 2018-03-25 | Discharge: 2018-03-26 | Disposition: A | Discharge status: Home / Self Care | Payer: Managed Care, Other (non HMO) | Source: Ambulatory Visit | Attending: Obstetrics and Gynecology | Admitting: Obstetrics and Gynecology

## 2018-03-25 ENCOUNTER — Encounter (HOSPITAL_BASED_OUTPATIENT_CLINIC_OR_DEPARTMENT_OTHER): Payer: Self-pay | Admitting: *Deleted

## 2018-03-25 DIAGNOSIS — N920 Excessive and frequent menstruation with regular cycle: Secondary | ICD-10-CM | POA: Diagnosis present

## 2018-03-25 DIAGNOSIS — D251 Intramural leiomyoma of uterus: Principal | ICD-10-CM | POA: Insufficient documentation

## 2018-03-25 DIAGNOSIS — Z91013 Allergy to seafood: Secondary | ICD-10-CM | POA: Diagnosis not present

## 2018-03-25 DIAGNOSIS — N800 Endometriosis of uterus: Secondary | ICD-10-CM | POA: Insufficient documentation

## 2018-03-25 DIAGNOSIS — N946 Dysmenorrhea, unspecified: Secondary | ICD-10-CM | POA: Diagnosis not present

## 2018-03-25 DIAGNOSIS — Z90710 Acquired absence of both cervix and uterus: Secondary | ICD-10-CM | POA: Diagnosis present

## 2018-03-25 DIAGNOSIS — D252 Subserosal leiomyoma of uterus: Secondary | ICD-10-CM | POA: Insufficient documentation

## 2018-03-25 HISTORY — PX: LAPAROSCOPIC ASSISTED VAGINAL HYSTERECTOMY: SHX5398

## 2018-03-25 LAB — TYPE AND SCREEN
ABO/RH(D): O POS
ANTIBODY SCREEN: NEGATIVE

## 2018-03-25 LAB — POCT PREGNANCY, URINE: PREG TEST UR: NEGATIVE

## 2018-03-25 SURGERY — HYSTERECTOMY, VAGINAL, LAPAROSCOPY-ASSISTED
Anesthesia: General | Site: Vagina

## 2018-03-25 MED ORDER — PROPOFOL 10 MG/ML IV BOLUS
INTRAVENOUS | Status: AC
  Filled 2018-03-25: qty 20

## 2018-03-25 MED ORDER — FENTANYL CITRATE (PF) 100 MCG/2ML IJ SOLN
25.0000 ug | INTRAMUSCULAR | Status: DC | PRN
  Administered 2018-03-25 (×2): 50 ug via INTRAVENOUS
  Filled 2018-03-25: qty 1

## 2018-03-25 MED ORDER — SODIUM CHLORIDE 0.9% FLUSH
9.0000 mL | INTRAVENOUS | Status: DC | PRN
  Filled 2018-03-25: qty 10

## 2018-03-25 MED ORDER — IBUPROFEN 800 MG PO TABS
800.0000 mg | ORAL_TABLET | Freq: Three times a day (TID) | ORAL | Status: DC | PRN
  Administered 2018-03-25 – 2018-03-26 (×2): 800 mg via ORAL
  Filled 2018-03-25: qty 1

## 2018-03-25 MED ORDER — MIDAZOLAM HCL 5 MG/5ML IJ SOLN
INTRAMUSCULAR | Status: DC | PRN
  Administered 2018-03-25: 2 mg via INTRAVENOUS

## 2018-03-25 MED ORDER — HYDROMORPHONE 1 MG/ML IV SOLN
INTRAVENOUS | Status: DC
  Administered 2018-03-25: 12:00:00 via INTRAVENOUS
  Filled 2018-03-25 (×3): qty 25

## 2018-03-25 MED ORDER — FENTANYL CITRATE (PF) 100 MCG/2ML IJ SOLN
INTRAMUSCULAR | Status: AC
  Filled 2018-03-25: qty 2

## 2018-03-25 MED ORDER — OXYCODONE-ACETAMINOPHEN 5-325 MG PO TABS
ORAL_TABLET | ORAL | Status: AC
  Filled 2018-03-25: qty 1

## 2018-03-25 MED ORDER — SCOPOLAMINE 1 MG/3DAYS TD PT72
MEDICATED_PATCH | TRANSDERMAL | Status: DC | PRN
  Administered 2018-03-25: 1 via TRANSDERMAL

## 2018-03-25 MED ORDER — PROPOFOL 10 MG/ML IV BOLUS
INTRAVENOUS | Status: DC | PRN
  Administered 2018-03-25: 200 mg via INTRAVENOUS

## 2018-03-25 MED ORDER — HYDROMORPHONE HCL 1 MG/ML IJ SOLN
0.2000 mg | INTRAMUSCULAR | Status: DC | PRN
  Filled 2018-03-25: qty 1

## 2018-03-25 MED ORDER — MIDAZOLAM HCL 2 MG/2ML IJ SOLN
INTRAMUSCULAR | Status: AC
  Filled 2018-03-25: qty 2

## 2018-03-25 MED ORDER — SUGAMMADEX SODIUM 200 MG/2ML IV SOLN
INTRAVENOUS | Status: AC
  Filled 2018-03-25: qty 2

## 2018-03-25 MED ORDER — IBUPROFEN 200 MG PO TABS
ORAL_TABLET | ORAL | Status: AC
  Filled 2018-03-25: qty 1

## 2018-03-25 MED ORDER — ONDANSETRON HCL 4 MG/2ML IJ SOLN
INTRAMUSCULAR | Status: AC
  Filled 2018-03-25: qty 2

## 2018-03-25 MED ORDER — KETOROLAC TROMETHAMINE 30 MG/ML IJ SOLN
INTRAMUSCULAR | Status: AC
  Filled 2018-03-25: qty 1

## 2018-03-25 MED ORDER — SIMETHICONE 80 MG PO CHEW
CHEWABLE_TABLET | ORAL | Status: AC
  Filled 2018-03-25: qty 1

## 2018-03-25 MED ORDER — SCOPOLAMINE 1 MG/3DAYS TD PT72
MEDICATED_PATCH | TRANSDERMAL | Status: AC
  Filled 2018-03-25: qty 1

## 2018-03-25 MED ORDER — LACTATED RINGERS IV SOLN
INTRAVENOUS | Status: DC
  Administered 2018-03-25: 20:00:00 via INTRAVENOUS
  Filled 2018-03-25 (×2): qty 1000

## 2018-03-25 MED ORDER — METOCLOPRAMIDE HCL 5 MG/ML IJ SOLN
10.0000 mg | Freq: Once | INTRAMUSCULAR | Status: DC | PRN
  Filled 2018-03-25: qty 2

## 2018-03-25 MED ORDER — ALUM & MAG HYDROXIDE-SIMETH 200-200-20 MG/5ML PO SUSP
30.0000 mL | ORAL | Status: DC | PRN
  Filled 2018-03-25: qty 30

## 2018-03-25 MED ORDER — NALOXONE HCL 0.4 MG/ML IJ SOLN
0.4000 mg | INTRAMUSCULAR | Status: DC | PRN
  Filled 2018-03-25: qty 1

## 2018-03-25 MED ORDER — SUGAMMADEX SODIUM 200 MG/2ML IV SOLN
INTRAVENOUS | Status: DC | PRN
  Administered 2018-03-25: 200 mg via INTRAVENOUS

## 2018-03-25 MED ORDER — ONDANSETRON HCL 4 MG PO TABS
4.0000 mg | ORAL_TABLET | Freq: Four times a day (QID) | ORAL | Status: DC | PRN
  Filled 2018-03-25: qty 1

## 2018-03-25 MED ORDER — DEXAMETHASONE SODIUM PHOSPHATE 10 MG/ML IJ SOLN
INTRAMUSCULAR | Status: AC
  Filled 2018-03-25: qty 1

## 2018-03-25 MED ORDER — ONDANSETRON HCL 4 MG/2ML IJ SOLN
4.0000 mg | Freq: Four times a day (QID) | INTRAMUSCULAR | Status: DC | PRN
  Administered 2018-03-25: 4 mg via INTRAVENOUS
  Filled 2018-03-25: qty 2

## 2018-03-25 MED ORDER — LACTATED RINGERS IV SOLN
INTRAVENOUS | Status: DC
  Filled 2018-03-25: qty 1000

## 2018-03-25 MED ORDER — LIDOCAINE HCL (CARDIAC) PF 100 MG/5ML IV SOSY
PREFILLED_SYRINGE | INTRAVENOUS | Status: DC | PRN
  Administered 2018-03-25: 100 mg via INTRAVENOUS

## 2018-03-25 MED ORDER — LACTATED RINGERS IV SOLN
INTRAVENOUS | Status: DC
  Administered 2018-03-25 (×2): via INTRAVENOUS
  Filled 2018-03-25: qty 1000

## 2018-03-25 MED ORDER — DIPHENHYDRAMINE HCL 50 MG/ML IJ SOLN
12.5000 mg | Freq: Four times a day (QID) | INTRAMUSCULAR | Status: DC | PRN
  Filled 2018-03-25: qty 0.25

## 2018-03-25 MED ORDER — VASOPRESSIN 20 UNIT/ML IV SOLN
INTRAVENOUS | Status: DC | PRN
  Administered 2018-03-25: 20 [IU]

## 2018-03-25 MED ORDER — LIDOCAINE 2% (20 MG/ML) 5 ML SYRINGE
INTRAMUSCULAR | Status: AC
  Filled 2018-03-25: qty 5

## 2018-03-25 MED ORDER — MEPERIDINE HCL 25 MG/ML IJ SOLN
6.2500 mg | INTRAMUSCULAR | Status: DC | PRN
  Filled 2018-03-25: qty 1

## 2018-03-25 MED ORDER — IBUPROFEN 200 MG PO TABS
ORAL_TABLET | ORAL | Status: AC
  Filled 2018-03-25: qty 3

## 2018-03-25 MED ORDER — CEFAZOLIN SODIUM-DEXTROSE 2-4 GM/100ML-% IV SOLN
INTRAVENOUS | Status: AC
  Filled 2018-03-25: qty 100

## 2018-03-25 MED ORDER — FENTANYL CITRATE (PF) 100 MCG/2ML IJ SOLN
INTRAMUSCULAR | Status: DC | PRN
  Administered 2018-03-25: 25 ug via INTRAVENOUS
  Administered 2018-03-25: 100 ug via INTRAVENOUS
  Administered 2018-03-25 (×3): 25 ug via INTRAVENOUS

## 2018-03-25 MED ORDER — DIPHENHYDRAMINE HCL 12.5 MG/5ML PO ELIX
12.5000 mg | ORAL_SOLUTION | Freq: Four times a day (QID) | ORAL | Status: DC | PRN
  Filled 2018-03-25: qty 5

## 2018-03-25 MED ORDER — CEFAZOLIN SODIUM-DEXTROSE 2-4 GM/100ML-% IV SOLN
2.0000 g | Freq: Once | INTRAVENOUS | Status: AC
  Administered 2018-03-25: 2 g via INTRAVENOUS
  Filled 2018-03-25: qty 100

## 2018-03-25 MED ORDER — ONDANSETRON HCL 4 MG/2ML IJ SOLN
4.0000 mg | Freq: Four times a day (QID) | INTRAMUSCULAR | Status: DC | PRN
  Filled 2018-03-25: qty 2

## 2018-03-25 MED ORDER — KETOROLAC TROMETHAMINE 30 MG/ML IJ SOLN
INTRAMUSCULAR | Status: DC | PRN
  Administered 2018-03-25: 30 mg via INTRAVENOUS

## 2018-03-25 MED ORDER — DEXAMETHASONE SODIUM PHOSPHATE 4 MG/ML IJ SOLN
INTRAMUSCULAR | Status: DC | PRN
  Administered 2018-03-25: 10 mg via INTRAVENOUS

## 2018-03-25 MED ORDER — SODIUM CHLORIDE FLUSH 0.9 % IV SOLN
INTRAVENOUS | Status: DC | PRN
  Administered 2018-03-25: 100 mL

## 2018-03-25 MED ORDER — SIMETHICONE 80 MG PO CHEW
80.0000 mg | CHEWABLE_TABLET | Freq: Four times a day (QID) | ORAL | Status: DC | PRN
Start: 2018-03-25 — End: 2018-03-26
  Administered 2018-03-25 – 2018-03-26 (×2): 80 mg via ORAL
  Filled 2018-03-25: qty 1

## 2018-03-25 MED ORDER — BUPIVACAINE HCL (PF) 0.25 % IJ SOLN
INTRAMUSCULAR | Status: DC | PRN
  Administered 2018-03-25: 10 mL

## 2018-03-25 MED ORDER — ROCURONIUM BROMIDE 100 MG/10ML IV SOLN
INTRAVENOUS | Status: DC | PRN
  Administered 2018-03-25: 70 mg via INTRAVENOUS
  Administered 2018-03-25: 10 mg via INTRAVENOUS

## 2018-03-25 MED ORDER — ROCURONIUM BROMIDE 100 MG/10ML IV SOLN
INTRAVENOUS | Status: AC
  Filled 2018-03-25: qty 1

## 2018-03-25 MED ORDER — ONDANSETRON HCL 4 MG/2ML IJ SOLN
INTRAMUSCULAR | Status: DC | PRN
  Administered 2018-03-25: 4 mg via INTRAVENOUS

## 2018-03-25 MED ORDER — GUAIFENESIN 100 MG/5ML PO SOLN
15.0000 mL | ORAL | Status: DC | PRN
  Filled 2018-03-25: qty 15

## 2018-03-25 MED ORDER — MENTHOL 3 MG MT LOZG
1.0000 | LOZENGE | OROMUCOSAL | Status: DC | PRN
  Filled 2018-03-25: qty 9

## 2018-03-25 MED ORDER — OXYCODONE-ACETAMINOPHEN 5-325 MG PO TABS
1.0000 | ORAL_TABLET | ORAL | Status: DC | PRN
  Administered 2018-03-25: 1 via ORAL
  Administered 2018-03-26: 0.5 via ORAL
  Filled 2018-03-25: qty 2

## 2018-03-25 SURGICAL SUPPLY — 40 items
APPLICATOR ARISTA FLEXITIP XL (MISCELLANEOUS) IMPLANT
CABLE HIGH FREQUENCY MONO STRZ (ELECTRODE) IMPLANT
CONT SPECI 4OZ STER CLIK (MISCELLANEOUS) ×3 IMPLANT
COVER BACK TABLE 60X90IN (DRAPES) ×3 IMPLANT
COVER MAYO STAND STRL (DRAPES) ×6 IMPLANT
DECANTER SPIKE VIAL GLASS SM (MISCELLANEOUS) IMPLANT
DERMABOND ADVANCED (GAUZE/BANDAGES/DRESSINGS) ×2
DERMABOND ADVANCED .7 DNX12 (GAUZE/BANDAGES/DRESSINGS) ×1 IMPLANT
DRSG COVADERM PLUS 2X2 (GAUZE/BANDAGES/DRESSINGS) IMPLANT
DRSG OPSITE POSTOP 3X4 (GAUZE/BANDAGES/DRESSINGS) ×3 IMPLANT
DURAPREP 26ML APPLICATOR (WOUND CARE) ×3 IMPLANT
ELECT REM PT RETURN 9FT ADLT (ELECTROSURGICAL)
ELECTRODE REM PT RTRN 9FT ADLT (ELECTROSURGICAL) IMPLANT
GLOVE BIO SURGEON STRL SZ 6.5 (GLOVE) ×6 IMPLANT
GLOVE BIO SURGEON STRL SZ7 (GLOVE) ×9 IMPLANT
GLOVE BIO SURGEONS STRL SZ 6.5 (GLOVE) ×3
GOWN STRL REUS W/TWL XL LVL3 (GOWN DISPOSABLE) ×12 IMPLANT
HEMOSTAT ARISTA ABSORB 3G PWDR (MISCELLANEOUS) IMPLANT
NEEDLE INSUFFLATION 120MM (ENDOMECHANICALS) ×3 IMPLANT
NS IRRIG 1000ML POUR BTL (IV SOLUTION) ×3 IMPLANT
PACK LAVH (CUSTOM PROCEDURE TRAY) ×3 IMPLANT
PACK TRENDGUARD 450 HYBRID PRO (MISCELLANEOUS) ×1 IMPLANT
PROTECTOR NERVE ULNAR (MISCELLANEOUS) ×6 IMPLANT
SET IRRIG TUBING LAPAROSCOPIC (IRRIGATION / IRRIGATOR) IMPLANT
SET IRRIG Y TYPE TUR BLADDER L (SET/KITS/TRAYS/PACK) IMPLANT
SHEARS HARMONIC ACE PLUS 36CM (ENDOMECHANICALS) ×3 IMPLANT
SUT VIC AB 1 CT1 18XBRD ANBCTR (SUTURE) ×2 IMPLANT
SUT VIC AB 1 CT1 8-18 (SUTURE) ×4
SUT VIC AB 2-0 CT1 (SUTURE) ×6 IMPLANT
SUT VIC AB 3-0 SH 27 (SUTURE) ×2
SUT VIC AB 3-0 SH 27X BRD (SUTURE) ×1 IMPLANT
SUT VIC AB 4-0 PS2 27 (SUTURE) ×6 IMPLANT
SUT VICRYL 0 TIES 12 18 (SUTURE) ×3 IMPLANT
SUT VICRYL 0 UR6 27IN ABS (SUTURE) IMPLANT
TOWEL OR 17X24 6PK STRL BLUE (TOWEL DISPOSABLE) ×6 IMPLANT
TRAY FOLEY W/BAG SLVR 14FR (SET/KITS/TRAYS/PACK) ×3 IMPLANT
TRENDGUARD 450 HYBRID PRO PACK (MISCELLANEOUS) ×3
TROCAR BLADELESS OPT 5 100 (ENDOMECHANICALS) ×9 IMPLANT
TUBING INSUF HEATED (TUBING) ×3 IMPLANT
WARMER LAPAROSCOPE (MISCELLANEOUS) ×3 IMPLANT

## 2018-03-25 NOTE — Transfer of Care (Signed)
Immediate Anesthesia Transfer of Care Note  Patient: Elizabeth Key  Procedure(s) Performed: Procedure(s) (LRB): LAPAROSCOPIC ASSISTED VAGINAL HYSTERECTOMY (N/A)  Patient Location: PACU  Anesthesia Type: General  Level of Consciousness: awake, sedated, patient cooperative and responds to stimulation  Airway & Oxygen Therapy: Patient Spontanous Breathing and Patient connected to Stephen O2  Post-op Assessment: Report given to PACU RN, Post -op Vital signs reviewed and stable and Patient moving all extremities  Post vital signs: Reviewed and stable  Complications: No apparent anesthesia complications

## 2018-03-25 NOTE — Op Note (Signed)
NAMEALEESHA, RINGSTAD MEDICAL RECORD [id number] ACCOUNT [id number] DATE OF BIRTH:Jan 21, 1976 FACILITY: [facility] LOCATION: [facility] PHYSICIAN:Ayse Mccartin Sandford Craze, MD  OPERATIVE REPORT  DATE OF PROCEDURE:  03/25/2018  PREOPERATIVE DIAGNOSES:  Menorrhagia, fibroids.  POSTOPERATIVE DIAGNOSES:  Menorrhagia, fibroids.  PROCEDURE:  Laparoscopic assisted vaginal hysterectomy.  SURGEON:  Richrd Prime, M.D.  ASSISTANT:   Paula Compton, MD  ANESTHESIA:  General.  INTRAVENOUS FLUIDS, AND URINE OUTPUT:  Per anesthesia notes. Urine output was clear at the end of the procedure.  ESTIMATED BLOOD LOSS:  Approximately 100 mL of urine.  COMPLICATIONS:  None.  PATHOLOGY:  Uterus and cervix.  DESCRIPTION OF PROCEDURE:  After informed consent was reviewed with the patient including risks, benefits and alternatives of the surgical procedure, she was transported to the OR, placed on the table in supine position.  General anesthesia was induced.   She was then placed in the [place] stirrups, prepped and draped in the normal sterile fashion.  Foley catheter was sterilely placed.  Using a tenaculum and an open-sided speculum, her cervix was grasped and the Hulka manipulator was placed.  Gloves and  gown were changed.    Attention was turned to the abdominal portion of the case.  An approximately 5 mm infraumbilical incision was made using the Veress needle and pneumoperitoneum was obtained after passing the hanging drop test and normal opening pressure.  After the  pneumoperitoneum was obtained using direct entry, a 5 mm trocar was placed at the umbilicus.  Accessory ports were placed on both the right and the left under direct visualization.  She had sebaceous cyst at the site of her right trocar.  This was  removed prior to placing the trocar.    A brief pelvic survey performed revealed normal appearing ovaries with a follicular cyst on her left and a uterus with several large wall 3 cm  sized fibroids.  The procedure was begun by grasping the uterus and elevating it with a single tooth tenaculum  and the round ligament was incised and the cardinal ligaments were excised as well to the level of the uterine artery, somewhat difficult given her known fibroid distorting her anatomy somewhat.  Bladder flap was created at the level of the uterine  artery to midline.  Attention was then turned to the left, which in a similar fashion, the round ligament was excised and the cardinal ligaments were also excised.  The pedicles were noted to be hemostatic.  Again, the bladder flap was created to the  midline.  The fibroid was again were distorting her anatomy.  The bladder flap was created.  The instruments were removed and the vaginal portion of the case was started.  The cervix was instilled with vasopressin 20-100.  The cervix was circumscribed  with Bovie cautery.  There was an attempt to get in anteriorly.  This was not able to be done.  Posterior was entered and posterior cul-de-sac was entered and a banana speculum was placed.  The uterosacral ligaments were ligated and held bilaterally on  each side.  Bites were taken.  Pedicles were found to be hemostatic to the level of the uterine artery.  The uterine artery was incised.  The uterus was delivered.  Some areas that were bleeding were controlled with several sutures.  The uterine arteries  were ligated and the pedicles were noted to be hemostatic bilaterally.  In the process, we had gotten in anteriorly and this was also noted to be hemostatic.  The uterus and cervix were delivered.  The uterosacral ligaments were plicated after the  posterior aspect of the cuff was reefed the uterosacral ligaments were held together.  The cuff was closed with 2-0 Vicryl.  Gloves and gown were changed.  Attention was turned back to the abdominal portion of the case.  Was noted to be hemostatic.  The  ureters were identified on both the right and left, noted to  be normal.  The pneumoperitoneum was evacuated.  The trocar sites were closed with 4-0 Vicryl as well as Dermabond.    The patient tolerated the procedure well.  Sponge, lap and needle counts were correct x2 per the operating staff.  AN/NUANCE  D:03/25/2018 T:03/25/2018 JOB:[id number]

## 2018-03-25 NOTE — Brief Op Note (Signed)
03/25/2018  10:37 AM  PATIENT:  Elizabeth Key  42 y.o. female  PRE-OPERATIVE DIAGNOSIS:  Menorrhagia, fibroids  POST-OPERATIVE DIAGNOSIS:  Menorrhagia, fibroids  PROCEDURE:  Procedure(s): LAPAROSCOPIC ASSISTED VAGINAL HYSTERECTOMY (N/A)  SURGEON:  Surgeon(s) and Role:    * Bovard-Stuckert, Fannye Myer, MD - Primary    * Paula Compton, MD - Assisting  ANESTHESIA:   local and general  EBL:  100 mL   BLOOD ADMINISTERED:none  DRAINS: Urinary Catheter (Foley)   LOCAL MEDICATIONS USED:  MARCAINE     SPECIMEN:  Source of Specimen:  cervix and cervix  DISPOSITION OF SPECIMEN:  PATHOLOGY  COUNTS:  YES  TOURNIQUET:  * No tourniquets in log *  DICTATION: .Other Dictation: Dictation Number [id number]  PLAN OF CARE: Admit for overnight observation  PATIENT DISPOSITION:  PACU - hemodynamically stable.   Delay start of Pharmacological VTE agent (>24hrs) due to surgical blood loss or risk of bleeding: not applicable

## 2018-03-25 NOTE — Interval H&P Note (Signed)
History and Physical Interval Note:  03/25/2018 7:36 AM  Elizabeth Key  has presented today for surgery, with the diagnosis of Menorrhagia, fibroids  The various methods of treatment have been discussed with the patient and family. After consideration of risks, benefits and other options for treatment, the patient has consented to  Procedure(s): LAPAROSCOPIC ASSISTED VAGINAL HYSTERECTOMY (N/A) as a surgical intervention .  The patient's history has been reviewed, patient examined, no change in status, stable for surgery.  I have reviewed the patient's chart and labs.  Questions were answered to the patient's satisfaction.     Shawn Carattini Bovard-Stuckert

## 2018-03-25 NOTE — Anesthesia Postprocedure Evaluation (Signed)
Anesthesia Post Note  Patient: Elizabeth Key  Procedure(s) Performed: LAPAROSCOPIC ASSISTED VAGINAL HYSTERECTOMY (N/A Vagina )     Patient location during evaluation: PACU Anesthesia Type: General Level of consciousness: sedated and patient cooperative Pain management: pain level controlled Vital Signs Assessment: post-procedure vital signs reviewed and stable Respiratory status: spontaneous breathing Cardiovascular status: stable Anesthetic complications: no    Last Vitals:  Vitals:   03/25/18 1145 03/25/18 1230  BP: 111/63   Pulse: 72 74  Resp: 10 13  Temp: 37.1 C 36.9 C  SpO2: 100%     Last Pain:  Vitals:   03/25/18 1230  TempSrc: Oral  PainSc:                  Nolon Nations

## 2018-03-25 NOTE — Progress Notes (Signed)
Day of Surgery Procedure(s) (LRB): LAPAROSCOPIC ASSISTED VAGINAL HYSTERECTOMY (N/A)  Subjective: Patient reports incisional pain and tolerating PO.  Some nausea, has walked.  Didn't want PCA  Objective: I have reviewed patient's vital signs and intake and output.  General: alert and no distress Resp: clear to auscultation bilaterally Cardio: regular rate and rhythm GI: normal findings: bowel sounds normal and soft, non-tender Extremities: extremities normal, atraumatic, no cyanosis or edema  Inc C/D/I  Assessment: s/p Procedure(s): LAPAROSCOPIC ASSISTED VAGINAL HYSTERECTOMY (N/A): stable and progressing well  Plan: Encourage ambulation routine postop care.  LOS: 0 days    Elizabeth Key 03/25/2018, 6:10 PM

## 2018-03-25 NOTE — Progress Notes (Signed)
03/25/2018 8:52 PM  PCA d/c 25 CC Dilaudid wasted in sharps, witnessed by Lyndel Pleasure RN. Young Brim, Arville Lime

## 2018-03-25 NOTE — Anesthesia Procedure Notes (Signed)
Procedure Name: Intubation Date/Time: 03/25/2018 8:07 AM Performed by: Justice Rocher, CRNA Pre-anesthesia Checklist: Patient identified, Emergency Drugs available, Suction available and Patient being monitored Patient Re-evaluated:Patient Re-evaluated prior to induction Oxygen Delivery Method: Circle system utilized Preoxygenation: Pre-oxygenation with 100% oxygen Induction Type: IV induction Ventilation: Mask ventilation without difficulty Laryngoscope Size: Mac and 3 Grade View: Grade II Tube type: Oral Tube size: 7.5 mm Number of attempts: 1 Airway Equipment and Method: Stylet and Oral airway Placement Confirmation: ETT inserted through vocal cords under direct vision,  positive ETCO2 and breath sounds checked- equal and bilateral Secured at: 23 cm Tube secured with: Tape Dental Injury: Teeth and Oropharynx as per pre-operative assessment

## 2018-03-25 NOTE — Anesthesia Preprocedure Evaluation (Signed)
Anesthesia Evaluation  Patient identified by MRN, date of birth, ID band Patient awake    Reviewed: Allergy & Precautions, NPO status , Patient's Chart, lab work & pertinent test results  History of Anesthesia Complications (+) PONV  Airway Mallampati: II  TM Distance: >3 FB Neck ROM: Full    Dental no notable dental hx.    Pulmonary neg pulmonary ROS,    Pulmonary exam normal breath sounds clear to auscultation       Cardiovascular negative cardio ROS Normal cardiovascular exam Rhythm:Regular Rate:Normal     Neuro/Psych negative neurological ROS  negative psych ROS   GI/Hepatic negative GI ROS, Neg liver ROS,   Endo/Other  negative endocrine ROS  Renal/GU negative Renal ROS  negative genitourinary   Musculoskeletal negative musculoskeletal ROS (+)   Abdominal   Peds negative pediatric ROS (+)  Hematology negative hematology ROS (+)   Anesthesia Other Findings   Reproductive/Obstetrics negative OB ROS                             Anesthesia Physical Anesthesia Plan  ASA: II  Anesthesia Plan: General   Post-op Pain Management:    Induction: Intravenous  PONV Risk Score and Plan: 4 or greater and Ondansetron, Dexamethasone, Midazolam and Scopolamine patch - Pre-op  Airway Management Planned: Oral ETT  Additional Equipment:   Intra-op Plan:   Post-operative Plan: Extubation in OR  Informed Consent: I have reviewed the patients History and Physical, chart, labs and discussed the procedure including the risks, benefits and alternatives for the proposed anesthesia with the patient or authorized representative who has indicated his/her understanding and acceptance.   Dental advisory given  Plan Discussed with: CRNA  Anesthesia Plan Comments:         Anesthesia Quick Evaluation

## 2018-03-26 DIAGNOSIS — D251 Intramural leiomyoma of uterus: Secondary | ICD-10-CM | POA: Diagnosis not present

## 2018-03-26 LAB — CBC
HEMATOCRIT: 28.4 % — AB (ref 36.0–46.0)
Hemoglobin: 9.8 g/dL — ABNORMAL LOW (ref 12.0–15.0)
MCH: 26.1 pg (ref 26.0–34.0)
MCHC: 34.5 g/dL (ref 30.0–36.0)
MCV: 75.5 fL — AB (ref 78.0–100.0)
PLATELETS: 319 10*3/uL (ref 150–400)
RBC: 3.76 MIL/uL — ABNORMAL LOW (ref 3.87–5.11)
RDW: 14.6 % (ref 11.5–15.5)
WBC: 12.3 10*3/uL — ABNORMAL HIGH (ref 4.0–10.5)

## 2018-03-26 LAB — BASIC METABOLIC PANEL
Anion gap: 5 (ref 5–15)
BUN: 9 mg/dL (ref 6–20)
CHLORIDE: 111 mmol/L (ref 98–111)
CO2: 27 mmol/L (ref 22–32)
CREATININE: 1.02 mg/dL — AB (ref 0.44–1.00)
Calcium: 8.6 mg/dL — ABNORMAL LOW (ref 8.9–10.3)
GFR calc Af Amer: >60 mL/min (ref >60)
GFR calc non Af Amer: >60 mL/min (ref >60)
Glucose, Bld: 113 mg/dL — ABNORMAL HIGH (ref 70–99)
POTASSIUM: 4.1 mmol/L (ref 3.5–5.1)
Sodium: 143 mmol/L (ref 135–145)

## 2018-03-26 MED ORDER — IBUPROFEN 200 MG PO TABS
ORAL_TABLET | ORAL | Status: AC
  Filled 2018-03-26: qty 4

## 2018-03-26 MED ORDER — OXYCODONE-ACETAMINOPHEN 5-325 MG PO TABS
ORAL_TABLET | ORAL | Status: AC
  Filled 2018-03-26: qty 1

## 2018-03-26 MED ORDER — IBUPROFEN 800 MG PO TABS: 800.0000 mg | ORAL_TABLET | Freq: Three times a day (TID) | ORAL | 1 refills | Status: DC | PRN

## 2018-03-26 MED ORDER — OXYCODONE-ACETAMINOPHEN 5-325 MG PO TABS: 1.0000 | ORAL_TABLET | Freq: Four times a day (QID) | ORAL | 0 refills | Status: DC | PRN

## 2018-03-26 MED ORDER — SIMETHICONE 80 MG PO CHEW
CHEWABLE_TABLET | ORAL | Status: AC
  Filled 2018-03-26: qty 1

## 2018-03-26 NOTE — Discharge Summary (Signed)
Physician Discharge Summary  Patient ID: Elizabeth Key MRN: [id number] DOB/AGE: [birth year] 42 y.o.  Admit date: 03/25/2018 Discharge date: 03/26/2018  Admission Diagnoses:  Discharge Diagnoses:  Active Problems:   Menorrhagia   S/P laparoscopic assisted vaginal hysterectomy (LAVH)   Discharged Condition: good  Hospital Course: admitted for LAVH, underwent surgery w/o complication.  D/C POD#1 when - ambulating, voiding, tol po, labs stable  Consults: None  Significant Diagnostic Studies: labs: CBC, BMP  Treatments: IV hydration, analgesia: Dilaudid and surgery: PCA  Discharge Exam: Blood pressure 116/75, pulse 88, temperature 98.8 F (37.1 C), resp. rate 16, height 5\' 5"  (1.651 m), weight 87.7 kg, last menstrual period 03/10/2018, SpO2 100 %. General appearance: alert and no distress Resp: clear to auscultation bilaterally Cardio: regular rate and rhythm GI: soft, non-tender; bowel sounds normal; no masses,  no organomegaly and Inc C/D/I Extremities: extremities normal, atraumatic, no cyanosis or edema  Disposition: Discharge disposition: 01-Home or Self Care       Discharge Instructions    Call MD for:  persistant nausea and vomiting   Complete by:  As directed    Call MD for:  redness, tenderness, or signs of infection (pain, swelling, redness, odor or green/yellow discharge around incision site)   Complete by:  As directed    Call MD for:  severe uncontrolled pain   Complete by:  As directed    Diet - low sodium heart healthy   Complete by:  As directed    Discharge instructions   Complete by:  As directed    Call [phone] with questions or problems   Increase activity slowly   Complete by:  As directed    May shower / Bathe   Complete by:  As directed    May walk up steps   Complete by:  As directed      Allergies as of 03/26/2018      Reactions   Shellfish Allergy Anaphylaxis   ALL SEAFOOD causes this reaction      Medication List     STOP taking these medications   naproxen sodium 220 MG tablet Commonly known as:  ALEVE     TAKE these medications   ibuprofen 800 MG tablet Commonly known as:  ADVIL,MOTRIN Take 1 tablet (800 mg total) by mouth every 8 (eight) hours as needed for moderate pain (mild pain).   oxyCODONE-acetaminophen 5-325 MG tablet Commonly known as:  PERCOCET/ROXICET Take 1-2 tablets by mouth every 6 (six) hours as needed for severe pain (moderate to severe pain (when tolerating fluids)).      Follow-up Information    Bovard-Stuckert, Berton Butrick, MD. Schedule an appointment as soon as possible for a visit in 2 week(s).   Specialty:  Obstetrics and Gynecology Why:  Incision check and 6 weeks for postop check Contact information: [address] [place] [address] [phone]           Signed: Janyth Contes 03/26/2018, 7:28 AM

## 2018-03-26 NOTE — Progress Notes (Signed)
1 Day Post-Op Procedure(s) (LRB): LAPAROSCOPIC ASSISTED VAGINAL HYSTERECTOMY (N/A)  Subjective: Patient reports incisional pain and tolerating PO.  Awaiting void have d/c'd catheter.    Objective: I have reviewed patient's vital signs, intake and output and labs.  General: alert and no distress Resp: clear to auscultation bilaterally Cardio: regular rate and rhythm GI: soft, non-tender; bowel sounds normal; no masses,  no organomegaly Extremities: extremities normal, atraumatic, no cyanosis or edema Inc: C/D/I  Assessment: s/p Procedure(s): LAPAROSCOPIC ASSISTED VAGINAL HYSTERECTOMY (N/A): stable and progressing well  Plan: Encourage ambulation Discharge home when ambulating, voiding and tolerating po - meds and diet   LOS: 0 days    Bobi Daudelin Bovard-Stuckert 03/26/2018, 7:12 AM

## 2018-03-27 ENCOUNTER — Encounter (HOSPITAL_BASED_OUTPATIENT_CLINIC_OR_DEPARTMENT_OTHER): Payer: Self-pay | Admitting: Obstetrics and Gynecology

## 2018-04-03 ENCOUNTER — Ambulatory Visit: Payer: Self-pay | Admitting: Podiatry

## 2019-10-11 ENCOUNTER — Ambulatory Visit: Payer: Managed Care, Other (non HMO) | Attending: Internal Medicine

## 2019-10-11 DIAGNOSIS — Z23 Encounter for immunization: Secondary | ICD-10-CM

## 2019-10-11 NOTE — Progress Notes (Signed)
   Covid-19 Vaccination Clinic  Name:  Elizabeth Key    MRN: [id number] DOB: 02/12/1976  10/11/2019  [name] was observed post Covid-19 immunization for 15 minutes without incidence. She was provided with Vaccine Information Sheet and instruction to access the V-Safe system.   [name] was instructed to call 911 with any severe reactions post vaccine: Marland Kitchen Difficulty breathing  . Swelling of your face and throat  . A fast heartbeat  . A bad rash all over your body  . Dizziness and weakness    Immunizations Administered    Name Date Dose VIS Date Route   Pfizer COVID-19 Vaccine 10/11/2019 12:00 PM 0.3 mL 07/24/2019 Intramuscular   Manufacturer: [place]   Lot: [id number]   [place]: [id number]

## 2019-11-03 ENCOUNTER — Ambulatory Visit: Payer: Managed Care, Other (non HMO) | Attending: Internal Medicine

## 2019-11-03 DIAGNOSIS — Z23 Encounter for immunization: Secondary | ICD-10-CM

## 2019-11-03 NOTE — Progress Notes (Signed)
   Covid-19 Vaccination Clinic  Name:  Elizabeth Key    MRN: [id number] DOB: 1975/09/13  11/03/2019  [name] was observed post Covid-19 immunization for 15 minutes without incident. She was provided with Vaccine Information Sheet and instruction to access the V-Safe system.   [name] was instructed to call 911 with any severe reactions post vaccine: Marland Kitchen Difficulty breathing  . Swelling of face and throat  . A fast heartbeat  . A bad rash all over body  . Dizziness and weakness   Immunizations Administered    Name Date Dose VIS Date Route   Pfizer COVID-19 Vaccine 11/03/2019  9:19 AM 0.3 mL 07/24/2019 Intramuscular   Manufacturer: [place]   Lot: [id number]   [place]: [id number]

## 2019-12-24 ENCOUNTER — Other Ambulatory Visit: Payer: Self-pay | Admitting: Obstetrics and Gynecology

## 2019-12-24 DIAGNOSIS — R928 Other abnormal and inconclusive findings on diagnostic imaging of breast: Secondary | ICD-10-CM

## 2020-01-04 ENCOUNTER — Other Ambulatory Visit: Payer: Self-pay

## 2020-01-04 ENCOUNTER — Ambulatory Visit: Payer: Managed Care, Other (non HMO)

## 2020-01-04 ENCOUNTER — Ambulatory Visit
Admission: RE | Admit: 2020-01-04 | Discharge: 2020-01-04 | Disposition: A | Discharge status: Home / Self Care | Payer: BC Managed Care – PPO | Source: Ambulatory Visit | Attending: Obstetrics and Gynecology | Admitting: Obstetrics and Gynecology

## 2020-01-04 DIAGNOSIS — R928 Other abnormal and inconclusive findings on diagnostic imaging of breast: Secondary | ICD-10-CM

## 2020-03-15 ENCOUNTER — Ambulatory Visit: Payer: Managed Care, Other (non HMO) | Admitting: Plastic Surgery

## 2020-03-15 ENCOUNTER — Other Ambulatory Visit: Payer: Self-pay

## 2020-03-15 ENCOUNTER — Encounter: Payer: Self-pay | Admitting: Plastic Surgery

## 2020-03-15 DIAGNOSIS — M545 Low back pain, unspecified: Secondary | ICD-10-CM

## 2020-03-15 DIAGNOSIS — M549 Dorsalgia, unspecified: Secondary | ICD-10-CM | POA: Insufficient documentation

## 2020-03-15 DIAGNOSIS — G8929 Other chronic pain: Secondary | ICD-10-CM | POA: Diagnosis not present

## 2020-03-15 DIAGNOSIS — M793 Panniculitis, unspecified: Secondary | ICD-10-CM | POA: Insufficient documentation

## 2020-03-15 NOTE — Progress Notes (Signed)
Patient ID: Elizabeth Key, female    DOB: August 20, 1975, 44 y.o.   MRN: [id number]   Chief Complaint  Patient presents with  . Advice Only  . Skin Problem    The patient is a 44 yrs old bf here for evaluation for panniculectomy.  She is 5 feet 6 inches tall and weighs 213 pounds.  She has lost 40 pounds in the past 8 months.  Her goal weight is 190 pounds.  She is not a smoker and does not have diabetes.  She has two c-sections and a hysterectomy in the past.  She complains of irritation and pain in her abdominal creases.  She has a rash at times and has to use lotions and creams to get it healed up.  She has a good strong abdominal wall.  I don't palpate any hernias or diastases.  There is some noticeable irritation at her superior pubic area.   Review of Systems  Constitutional: Negative.  Negative for activity change and appetite change.  HENT: Negative.   Eyes: Negative.   Respiratory: Negative.  Negative for chest tightness and shortness of breath.   Cardiovascular: Negative.   Gastrointestinal: Negative.  Negative for abdominal pain.  Endocrine: Negative.   Genitourinary: Negative.   Musculoskeletal: Positive for back pain. Negative for gait problem.  Skin: Positive for color change.  Neurological: Negative.   Hematological: Negative.   Psychiatric/Behavioral: Negative.     Past Medical History:  Diagnosis Date  . Admission for tubal ligation 02/25/2014  . HSV-1 (herpes simplex virus 1) infection   . HSV-2 (herpes simplex virus 2) infection   . Major depression   . Migraines   . Plantar fasciitis of right foot   . PONV (postoperative nausea and vomiting)    low oxygen saturation in recovery  . Tension headache   . Vitamin D deficiency     Past Surgical History:  Procedure Laterality Date  . BREAST REDUCTION SURGERY    . CESAREAN SECTION    . LAPAROSCOPIC ASSISTED VAGINAL HYSTERECTOMY N/A 03/25/2018   Procedure: LAPAROSCOPIC ASSISTED VAGINAL HYSTERECTOMY;   Surgeon: Janyth Contes, MD;  Location: [facility];  Service: Gynecology;  Laterality: N/A;  . LAPAROSCOPIC BILATERAL SALPINGECTOMY Bilateral 02/26/2014   Procedure: LAPAROSCOPIC BILATERAL SALPINGECTOMY;  Surgeon: Janyth Contes, MD;  Location: [facility];  Service: Gynecology;  Laterality: Bilateral;      Current Outpatient Medications:  .  buPROPion (WELLBUTRIN XL) 150 MG 24 hr tablet, bupropion HCl XL 150 mg 24 hr tablet, extended release, Disp: , Rfl:  .  estradiol (ESTRACE) 0.1 MG/GM vaginal cream, estradiol 0.01% (0.1 mg/gram) vaginal cream, Disp: , Rfl:  .  SUMAtriptan (IMITREX) 50 MG tablet, sumatriptan 50 mg tablet, Disp: , Rfl:  .  topiramate (TOPAMAX) 50 MG tablet, topiramate 50 mg tablet, Disp: , Rfl:  .  ibuprofen (ADVIL,MOTRIN) 800 MG tablet, Take 1 tablet (800 mg total) by mouth every 8 (eight) hours as needed for moderate pain (mild pain). (Patient not taking: Reported on 03/15/2020), Disp: 45 tablet, Rfl: 1 .  oxyCODONE-acetaminophen (PERCOCET/ROXICET) 5-325 MG tablet, Take 1-2 tablets by mouth every 6 (six) hours as needed for severe pain (moderate to severe pain (when tolerating fluids)). (Patient not taking: Reported on 03/15/2020), Disp: 20 tablet, Rfl: 0   Objective:   Vitals:   03/15/20 0800  BP: 122/81  Pulse: 87  Temp: 98.8 F (37.1 C)  SpO2: 99%    Physical Exam Vitals and nursing note reviewed.  Constitutional:      Appearance: Normal appearance.  HENT:     Head: Normocephalic and atraumatic.  Eyes:     Extraocular Movements: Extraocular movements intact.  Cardiovascular:     Rate and Rhythm: Normal rate.     Pulses: Normal pulses.  Pulmonary:     Effort: Pulmonary effort is normal. No respiratory distress.  Abdominal:     General: Abdomen is flat. There is no distension.     Tenderness: There is no abdominal tenderness.  Skin:    General: Skin is warm.  Neurological:     General: No focal deficit present.     Mental Status:  She is alert and oriented to person, place, and time.  Psychiatric:        Mood and Affect: Mood normal.        Behavior: Behavior normal.        Thought Content: Thought content normal.     Assessment & Plan:  Panniculitis - Plan: Ambulatory referral to Physical Therapy  Chronic bilateral low back pain without sciatica  Recommend consultation with healthy weight and wellness and physical therapy.  Patient is a good candidate for a panniculectomy or abdominoplasty.  We discussed that she'll have a better overall long-term result with surgery if she is closer to her goal weight.  We'll plan to reconvene in 3 months.  Pictures were obtained of the patient and placed in the chart with the patient's or guardian's permission.   [place], DO

## 2020-03-16 ENCOUNTER — Encounter (INDEPENDENT_AMBULATORY_CARE_PROVIDER_SITE_OTHER): Payer: Self-pay

## 2020-06-17 ENCOUNTER — Ambulatory Visit: Payer: BC Managed Care – PPO | Admitting: Plastic Surgery

## 2020-12-27 ENCOUNTER — Ambulatory Visit: Payer: BC Managed Care – PPO | Admitting: Neurology

## 2021-01-13 ENCOUNTER — Inpatient Hospital Stay (HOSPITAL_COMMUNITY)
Admission: EM | Admit: 2021-01-13 | Discharge: 2021-01-15 | DRG: 379 | Disposition: A | Discharge status: Home / Self Care | Payer: BC Managed Care – PPO | Attending: Internal Medicine | Admitting: Internal Medicine

## 2021-01-13 ENCOUNTER — Other Ambulatory Visit: Payer: Self-pay

## 2021-01-13 ENCOUNTER — Encounter (HOSPITAL_COMMUNITY): Payer: Self-pay

## 2021-01-13 DIAGNOSIS — Z8249 Family history of ischemic heart disease and other diseases of the circulatory system: Secondary | ICD-10-CM

## 2021-01-13 DIAGNOSIS — Z833 Family history of diabetes mellitus: Secondary | ICD-10-CM

## 2021-01-13 DIAGNOSIS — Z20822 Contact with and (suspected) exposure to covid-19: Secondary | ICD-10-CM | POA: Diagnosis present

## 2021-01-13 DIAGNOSIS — K625 Hemorrhage of anus and rectum: Secondary | ICD-10-CM

## 2021-01-13 DIAGNOSIS — G43909 Migraine, unspecified, not intractable, without status migrainosus: Secondary | ICD-10-CM | POA: Diagnosis not present

## 2021-01-13 DIAGNOSIS — R197 Diarrhea, unspecified: Principal | ICD-10-CM

## 2021-01-13 DIAGNOSIS — F339 Major depressive disorder, recurrent, unspecified: Secondary | ICD-10-CM | POA: Diagnosis not present

## 2021-01-13 DIAGNOSIS — Z803 Family history of malignant neoplasm of breast: Secondary | ICD-10-CM

## 2021-01-13 DIAGNOSIS — K921 Melena: Principal | ICD-10-CM | POA: Diagnosis present

## 2021-01-13 DIAGNOSIS — Z79899 Other long term (current) drug therapy: Secondary | ICD-10-CM

## 2021-01-13 DIAGNOSIS — Z91013 Allergy to seafood: Secondary | ICD-10-CM

## 2021-01-13 DIAGNOSIS — F32A Depression, unspecified: Secondary | ICD-10-CM | POA: Diagnosis present

## 2021-01-13 LAB — RESP PANEL BY RT-PCR (FLU A&B, COVID) ARPGX2
Influenza A by PCR: NEGATIVE
Influenza B by PCR: NEGATIVE
SARS Coronavirus 2 by RT PCR: NEGATIVE

## 2021-01-13 LAB — CBC WITH DIFFERENTIAL/PLATELET
Abs Immature Granulocytes: 0.05 10*3/uL (ref 0.00–0.07)
Basophils Absolute: 0.1 10*3/uL (ref 0.0–0.1)
Basophils Relative: 0 %
Eosinophils Absolute: 0 10*3/uL (ref 0.0–0.5)
Eosinophils Relative: 0 %
HCT: 39.8 % (ref 36.0–46.0)
Hemoglobin: 13.9 g/dL (ref 12.0–15.0)
Immature Granulocytes: 0 %
Lymphocytes Relative: 13 %
Lymphs Abs: 2 10*3/uL (ref 0.7–4.0)
MCH: 28.8 pg (ref 26.0–34.0)
MCHC: 34.9 g/dL (ref 30.0–36.0)
MCV: 82.6 fL (ref 80.0–100.0)
Monocytes Absolute: 1 10*3/uL (ref 0.1–1.0)
Monocytes Relative: 7 %
Neutro Abs: 12 10*3/uL — ABNORMAL HIGH (ref 1.7–7.7)
Neutrophils Relative %: 80 %
Platelets: 350 10*3/uL (ref 150–400)
RBC: 4.82 MIL/uL (ref 3.87–5.11)
RDW: 12 % (ref 11.5–15.5)
WBC: 15.1 10*3/uL — ABNORMAL HIGH (ref 4.0–10.5)
nRBC: 0 % (ref 0.0–0.2)

## 2021-01-13 LAB — URINALYSIS, ROUTINE W REFLEX MICROSCOPIC
Bilirubin Urine: NEGATIVE
Glucose, UA: NEGATIVE mg/dL
Hgb urine dipstick: NEGATIVE
Ketones, ur: 20 mg/dL — AB
Leukocytes,Ua: NEGATIVE
Nitrite: NEGATIVE
Protein, ur: NEGATIVE mg/dL
Specific Gravity, Urine: 1.012 (ref 1.005–1.030)
pH: 5 (ref 5.0–8.0)

## 2021-01-13 LAB — COMPREHENSIVE METABOLIC PANEL
ALT: 18 U/L (ref 0–44)
AST: 19 U/L (ref 15–41)
Albumin: 4.4 g/dL (ref 3.5–5.0)
Alkaline Phosphatase: 65 U/L (ref 38–126)
Anion gap: 11 (ref 5–15)
BUN: 10 mg/dL (ref 6–20)
CO2: 24 mmol/L (ref 22–32)
Calcium: 9.9 mg/dL (ref 8.9–10.3)
Chloride: 105 mmol/L (ref 98–111)
Creatinine, Ser: 0.9 mg/dL (ref 0.44–1.00)
GFR, Estimated: >60 mL/min (ref >60)
Glucose, Bld: 108 mg/dL — ABNORMAL HIGH (ref 70–99)
Potassium: 3.9 mmol/L (ref 3.5–5.1)
Sodium: 140 mmol/L (ref 135–145)
Total Bilirubin: 1.5 mg/dL — ABNORMAL HIGH (ref 0.3–1.2)
Total Protein: 8 g/dL (ref 6.5–8.1)

## 2021-01-13 LAB — HEMOGLOBIN AND HEMATOCRIT, BLOOD
HCT: 43.6 % (ref 36.0–46.0)
Hemoglobin: 14.8 g/dL (ref 12.0–15.0)

## 2021-01-13 LAB — TYPE AND SCREEN
ABO/RH(D): O POS
Antibody Screen: NEGATIVE

## 2021-01-13 LAB — LIPASE, BLOOD: Lipase: 24 U/L (ref 11–51)

## 2021-01-13 MED ORDER — SODIUM CHLORIDE 0.9 % IV SOLN: INTRAVENOUS | Status: AC

## 2021-01-13 MED ORDER — ONDANSETRON HCL 4 MG/2ML IJ SOLN: 4.0000 mg | Freq: Four times a day (QID) | INTRAMUSCULAR | Status: DC | PRN

## 2021-01-13 MED ORDER — HYDROCODONE-ACETAMINOPHEN 5-325 MG PO TABS
1.0000 | ORAL_TABLET | ORAL | Status: DC | PRN
  Administered 2021-01-15: 2 via ORAL
  Filled 2021-01-13: qty 2

## 2021-01-13 MED ORDER — ACETAMINOPHEN 650 MG RE SUPP: 650.0000 mg | Freq: Four times a day (QID) | RECTAL | Status: DC | PRN

## 2021-01-13 MED ORDER — BUPROPION HCL ER (XL) 150 MG PO TB24
300.0000 mg | ORAL_TABLET | Freq: Every day | ORAL | Status: DC
  Administered 2021-01-14 – 2021-01-15 (×2): 300 mg via ORAL
  Filled 2021-01-13 (×2): qty 2

## 2021-01-13 MED ORDER — TOPIRAMATE 25 MG PO TABS
50.0000 mg | ORAL_TABLET | Freq: Every day | ORAL | Status: DC
  Administered 2021-01-14 – 2021-01-15 (×2): 50 mg via ORAL
  Filled 2021-01-13 (×2): qty 2

## 2021-01-13 MED ORDER — SODIUM CHLORIDE 0.9% FLUSH: 10.0000 mL | INTRAVENOUS | Status: DC | PRN

## 2021-01-13 MED ORDER — SODIUM CHLORIDE 0.9% FLUSH: 10.0000 mL | Freq: Two times a day (BID) | INTRAVENOUS | Status: DC

## 2021-01-13 MED ORDER — ACETAMINOPHEN 325 MG PO TABS: 650.0000 mg | ORAL_TABLET | Freq: Four times a day (QID) | ORAL | Status: DC | PRN

## 2021-01-13 MED ORDER — ONDANSETRON HCL 4 MG PO TABS: 4.0000 mg | ORAL_TABLET | Freq: Four times a day (QID) | ORAL | Status: DC | PRN

## 2021-01-13 MED ORDER — AZITHROMYCIN 250 MG PO TABS
500.0000 mg | ORAL_TABLET | Freq: Every day | ORAL | Status: DC
  Administered 2021-01-13 – 2021-01-14 (×2): 500 mg via ORAL
  Filled 2021-01-13 (×2): qty 2

## 2021-01-13 NOTE — ED Notes (Signed)
SBAR given to Marney Doctor, RN- per receiving RN pt can come up.

## 2021-01-13 NOTE — H&P (Signed)
History and Physical    Elizabeth Key EGB:[id number] DOB: 01-03-1976 DOA: 01/13/2021  PCP: [facility]   Patient coming from: Home   Chief Complaint: Abdominal cramping, bloody diarrhea   HPI: Elizabeth Key is a 45 y.o. female with medical history significant for depression and chronic migraines, now presenting to the emergency department for abdominal cramping and bloody diarrhea.  Patient reports that she was in her usual state of health until waking at 1 AM with cramping pain in her lower abdomen.  She had diarrhea with red blood and mucus.  She continued to have pain and lower abdominal discomfort for couple of hours before that subsided, but bloody diarrhea has persisted throughout the day.  She denies any nausea or vomiting.  She does not know of any sick contacts and denies any recent travel.  She was eating coleslaw last night from a fast food restaurant.  He denies any history of similar symptoms has never had a colonoscopy.   ED Course: Upon arrival to the ED, patient is found to be afebrile, saturating well on room air, slightly tachycardic, and with stable blood pressure.  Chemistry panel is unremarkable.  CBC notable for leukocytosis to 15,100 and normal hemoglobin.  GI pathogen panel and fecal occult blood testing were ordered from the emergency department but not yet collected.  Review of Systems:  All other systems reviewed and apart from HPI, are negative.  Past Medical History:  Diagnosis Date  . Admission for tubal ligation 02/25/2014  . HSV-1 (herpes simplex virus 1) infection   . HSV-2 (herpes simplex virus 2) infection   . Major depression   . Migraines   . Plantar fasciitis of right foot   . PONV (postoperative nausea and vomiting)    low oxygen saturation in recovery  . Tension headache   . Vitamin D deficiency     Past Surgical History:  Procedure Laterality Date  . BREAST REDUCTION SURGERY    . CESAREAN SECTION     . LAPAROSCOPIC ASSISTED VAGINAL HYSTERECTOMY N/A 03/25/2018   Procedure: LAPAROSCOPIC ASSISTED VAGINAL HYSTERECTOMY;  Surgeon: Janyth Contes, MD;  Location: [facility];  Service: Gynecology;  Laterality: N/A;  . LAPAROSCOPIC BILATERAL SALPINGECTOMY Bilateral 02/26/2014   Procedure: LAPAROSCOPIC BILATERAL SALPINGECTOMY;  Surgeon: Janyth Contes, MD;  Location: [facility];  Service: Gynecology;  Laterality: Bilateral;    Social History:   reports that she has never smoked. She has never used smokeless tobacco. She reports that she does not drink alcohol and does not use drugs.  Allergies  Allergen Reactions  . Shellfish Allergy Anaphylaxis    ALL SEAFOOD causes this reaction    Family History  Problem Relation Age of Onset  . Breast cancer Cousin   . Hypertension Mother   . Diabetes Mother      Prior to Admission medications   Medication Sig Start Date End Date Taking? Authorizing Provider  buPROPion (WELLBUTRIN XL) 150 MG 24 hr tablet bupropion HCl XL 150 mg 24 hr tablet, extended release 08/05/19   [provider]  estradiol (ESTRACE) 0.1 MG/GM vaginal cream estradiol 0.01% (0.1 mg/gram) vaginal cream    [provider]  ibuprofen (ADVIL,MOTRIN) 800 MG tablet Take 1 tablet (800 mg total) by mouth every 8 (eight) hours as needed for moderate pain (mild pain). Patient not taking: Reported on 03/15/2020 03/26/18   Bovard-Stuckert, Jeral Fruit, MD  oxyCODONE-acetaminophen (PERCOCET/ROXICET) 5-325 MG tablet Take 1-2 tablets by mouth every 6 (six) hours as  needed for severe pain (moderate to severe pain (when tolerating fluids)). Patient not taking: Reported on 03/15/2020 03/26/18   Bovard-Stuckert, Jeral Fruit, MD  SUMAtriptan (IMITREX) 50 MG tablet sumatriptan 50 mg tablet    [provider]  topiramate (TOPAMAX) 50 MG tablet topiramate 50 mg tablet    [provider]    Physical Exam: Vitals:   01/13/21 1809 01/13/21 1812 01/13/21 1950  01/13/21 2015  BP: (!) 143/88  (!) 142/84 (!) 140/98  Pulse: (!) 106  74 80  Resp: 16  17 17   Temp: 98.4 F (36.9 C)   98.6 F (37 C)  TempSrc: Oral   Oral  SpO2: 99%  99% 100%  Weight:  96.2 kg    Height:  5\' 6"  (1.676 m)      Constitutional: NAD, calm  Eyes: PERTLA, lids and conjunctivae normal ENMT: Mucous membranes are moist. Posterior pharynx clear of any exudate or lesions.   Neck: supple, no masses  Respiratory: no wheezing, no crackles. No accessory muscle use.  Cardiovascular: S1 & S2 heard, regular rate and rhythm. No extremity edema.  Abdomen: No distension, no tenderness, soft. Bowel sounds active.  Musculoskeletal: no clubbing / cyanosis. No joint deformity upper and lower extremities.   Skin: no significant rashes, lesions, ulcers. Warm, dry, well-perfused. Neurologic: CN 2-12 grossly intact. Sensation intact. Moving all extremities.  Psychiatric: Alert and oriented to person, place, and situation. Very pleasant and cooperative.    Labs and Imaging on Admission: I have personally reviewed following labs and imaging studies  CBC: Recent Labs  Lab 01/13/21 1834  WBC 15.1*  NEUTROABS 12.0*  HGB 13.9  HCT 39.8  MCV 82.6  PLT 440   Basic Metabolic Panel: Recent Labs  Lab 01/13/21 1834  NA 140  K 3.9  CL 105  CO2 24  GLUCOSE 108*  BUN 10  CREATININE 0.90  CALCIUM 9.9   GFR: Estimated Creatinine Clearance: 92.3 mL/min (by C-G formula based on SCr of 0.9 mg/dL). Liver Function Tests: Recent Labs  Lab 01/13/21 1834  AST 19  ALT 18  ALKPHOS 65  BILITOT 1.5*  PROT 8.0  ALBUMIN 4.4   Recent Labs  Lab 01/13/21 1834  LIPASE 24   No results for input(s): AMMONIA in the last 168 hours. Coagulation Profile: No results for input(s): INR, PROTIME in the last 168 hours. Cardiac Enzymes: No results for input(s): CKTOTAL, CKMB, CKMBINDEX, TROPONINI in the last 168 hours. BNP (last 3 results) No results for input(s): PROBNP in the last 8760  hours. HbA1C: No results for input(s): HGBA1C in the last 72 hours. CBG: No results for input(s): GLUCAP in the last 168 hours. Lipid Profile: No results for input(s): CHOL, HDL, LDLCALC, TRIG, CHOLHDL, LDLDIRECT in the last 72 hours. Thyroid Function Tests: No results for input(s): TSH, T4TOTAL, FREET4, T3FREE, THYROIDAB in the last 72 hours. Anemia Panel: No results for input(s): VITAMINB12, FOLATE, FERRITIN, TIBC, IRON, RETICCTPCT in the last 72 hours. Urine analysis:    Component Value Date/Time   COLORURINE YELLOW 01/18/2013 0552   APPEARANCEUR CLOUDY (A) 01/18/2013 0552   LABSPEC 1.017 01/18/2013 0552   PHURINE 5.0 01/18/2013 0552   GLUCOSEU NEGATIVE 01/18/2013 0552   HGBUR LARGE (A) 01/18/2013 0552   BILIRUBINUR NEGATIVE 01/18/2013 0552   KETONESUR NEGATIVE 01/18/2013 0552   PROTEINUR NEGATIVE 01/18/2013 0552   UROBILINOGEN 1.0 01/18/2013 0552   NITRITE NEGATIVE 01/18/2013 0552   LEUKOCYTESUR LARGE (A) 01/18/2013 0552   Sepsis Labs: @LABRCNTIP (procalcitonin:4,lacticidven:4) )No results found for  this or any previous visit (from the past 240 hour(s)).   Radiological Exams on Admission: No results found.   Assessment/Plan   1. Bloody diarrhea  - Presents with one day of diarrhea with blood and mucus, has leukocytosis but is afebrile with stable BP and normal initial Hgb  - GI pathogen panel is being sent from ED  - Start IVF and azithromycin, allow clear liquid diet for now, trend H&H, follow-up GI pathogen panel results, repeat chemistry panel in am    2. Depression  - Continue Wellbutrin    3. Chronic migraine  - Continue topiramate    DVT prophylaxis: SCDs  Code Status: Full  Level of Care: Level of care: Med-Surg Family Communication: none present Disposition Plan:  Patient is from: Home  Anticipated d/c is to: Home  Anticipated d/c date is: 6/4 or 01/15/21 Patient currently: Pending trend in H&H, GI pathogen panel  Consults called: None  Admission  status: Observation     Vianne Bulls, MD [facility]  01/13/2021, 8:55 PM

## 2021-01-13 NOTE — ED Provider Notes (Signed)
Emergency Medicine Provider Triage Evaluation Note  [place] , a 45 y.o. female  was evaluated in triage.  Pt complains of GI bleed.  Review of Systems  Positive: Abdominal pain, diarrhea, blood per rectum, nausea Negative: Fever, cp, sob  Physical Exam  BP (!) 143/88 (BP Location: Left Arm)   Pulse (!) 106   Temp 98.4 F (36.9 C) (Oral)   Resp 16   LMP 03/10/2018 (Exact Date)   SpO2 99%  Gen:   Awake, no distress   Resp:  Normal effort  MSK:   Moves extremities without difficulty  Other:    Medical Decision Making  Medically screening exam initiated at 6:11 PM.  Appropriate orders placed.  Stanisha Mystique Oblinger was informed that the remainder of the evaluation will be completed by another provider, this initial triage assessment does not replace that evaluation, and the importance of remaining in the ED until their evaluation is complete.  Having abd pain along with multiple bouts of BMs throughout the day with dark blood, but not black/tarry.  No on blood thinner, no constipation no rectal pain.    Domenic Moras, PA-C 01/13/21 1813    Milton Ferguson, MD 01/13/21 2252

## 2021-01-13 NOTE — ED Triage Notes (Signed)
Patient reports that she has had numerous liquid stools today with dark red blood in the toilet. Patient c/o lower abdominal pain. Patient denies rectal pain.

## 2021-01-13 NOTE — ED Provider Notes (Signed)
[facility] DEPT Provider Note   CSN: [id number] Arrival date & time: 01/13/21  1753     History Chief Complaint  Patient presents with  . Blood In Stools    Elizabeth Key is a 45 y.o. female.  Less than 24 hours of diarrhea, significant for some blood as well.  No known sick contacts.  Ate some leftover cookout food.  No abdominal pain with cramping prior to diarrheal episodes.  Diarrhea is slowing down.  No lightheadedness.  No chest pain no shortness of breath.  No blood thinners.  No history of the same.        Past Medical History:  Diagnosis Date  . Admission for tubal ligation 02/25/2014  . HSV-1 (herpes simplex virus 1) infection   . HSV-2 (herpes simplex virus 2) infection   . Major depression   . Migraines   . Plantar fasciitis of right foot   . PONV (postoperative nausea and vomiting)    low oxygen saturation in recovery  . Tension headache   . Vitamin D deficiency     Patient Active Problem List   Diagnosis Date Noted  . Panniculitis 03/15/2020  . Back pain 03/15/2020  . S/P laparoscopic assisted vaginal hysterectomy (LAVH) 03/25/2018  . Breast lump 02/25/2018  . Chlamydial infection 02/25/2018  . Dysmenorrhea 02/25/2018  . Menorrhagia 02/25/2018  . Plantar fasciitis 02/25/2018  . S/P tubal ligation 02/26/2014  . Admission for tubal ligation 02/25/2014  . Major depression, recurrent, chronic ([place]) 12/09/2013  . Tension type headache 09/21/2013  . Migraine 12/10/2012    Past Surgical History:  Procedure Laterality Date  . BREAST REDUCTION SURGERY    . CESAREAN SECTION    . LAPAROSCOPIC ASSISTED VAGINAL HYSTERECTOMY N/A 03/25/2018   Procedure: LAPAROSCOPIC ASSISTED VAGINAL HYSTERECTOMY;  Surgeon: Janyth Contes, MD;  Location: [facility];  Service: Gynecology;  Laterality: N/A;  . LAPAROSCOPIC BILATERAL SALPINGECTOMY Bilateral 02/26/2014   Procedure: LAPAROSCOPIC BILATERAL SALPINGECTOMY;   Surgeon: Janyth Contes, MD;  Location: [facility];  Service: Gynecology;  Laterality: Bilateral;     OB History   No obstetric history on file.     Family History  Problem Relation Age of Onset  . Breast cancer Cousin   . Hypertension Mother   . Diabetes Mother     Social History   Tobacco Use  . Smoking status: Never Smoker  . Smokeless tobacco: Never Used  Vaping Use  . Vaping Use: Never used  Substance Use Topics  . Alcohol use: No  . Drug use: No    Home Medications Prior to Admission medications   Medication Sig Start Date End Date Taking? Authorizing Provider  buPROPion (WELLBUTRIN XL) 150 MG 24 hr tablet bupropion HCl XL 150 mg 24 hr tablet, extended release 08/05/19   [provider]  estradiol (ESTRACE) 0.1 MG/GM vaginal cream estradiol 0.01% (0.1 mg/gram) vaginal cream    [provider]  ibuprofen (ADVIL,MOTRIN) 800 MG tablet Take 1 tablet (800 mg total) by mouth every 8 (eight) hours as needed for moderate pain (mild pain). Patient not taking: Reported on 03/15/2020 03/26/18   Bovard-Stuckert, Jeral Fruit, MD  oxyCODONE-acetaminophen (PERCOCET/ROXICET) 5-325 MG tablet Take 1-2 tablets by mouth every 6 (six) hours as needed for severe pain (moderate to severe pain (when tolerating fluids)). Patient not taking: Reported on 03/15/2020 03/26/18   Bovard-Stuckert, Jeral Fruit, MD  SUMAtriptan (IMITREX) 50 MG tablet sumatriptan 50 mg tablet    [provider]  topiramate (TOPAMAX) 50 MG  tablet topiramate 50 mg tablet    [provider]    Allergies    Shellfish allergy  Review of Systems   Review of Systems  Constitutional: Positive for chills and diaphoresis. Negative for fever.  HENT: Negative for congestion and rhinorrhea.   Respiratory: Negative for cough and shortness of breath.   Cardiovascular: Negative for chest pain and palpitations.  Gastrointestinal: Positive for anal bleeding, blood in stool, diarrhea and nausea. Negative for  vomiting.  Genitourinary: Negative for difficulty urinating and dysuria.  Musculoskeletal: Negative for arthralgias and back pain.  Skin: Negative for rash and wound.  Neurological: Negative for light-headedness and headaches.    Physical Exam Updated Vital Signs BP (!) 142/84 (BP Location: Right Arm)   Pulse 74   Temp 98.4 F (36.9 C) (Oral)   Resp 17   Ht 5\' 6"  (1.676 m)   Wt 96.2 kg   LMP 03/10/2018 (Exact Date)   SpO2 99%   BMI 34.22 kg/m   Physical Exam Vitals and nursing note reviewed. Exam conducted with a chaperone present.  Constitutional:      General: She is not in acute distress.    Appearance: Normal appearance.  HENT:     Head: Normocephalic and atraumatic.     Nose: No rhinorrhea.     Mouth/Throat:     Mouth: Mucous membranes are moist.  Eyes:     General:        Right eye: No discharge.        Left eye: No discharge.     Conjunctiva/sclera: Conjunctivae normal.  Cardiovascular:     Rate and Rhythm: Normal rate and regular rhythm.  Pulmonary:     Effort: Pulmonary effort is normal. No respiratory distress.     Breath sounds: No stridor.  Abdominal:     General: Abdomen is flat. There is no distension.     Palpations: Abdomen is soft.     Tenderness: There is no abdominal tenderness. There is no guarding.  Genitourinary:    Comments: Bright red blood on digital rectal exam Musculoskeletal:        General: No tenderness or signs of injury.  Skin:    General: Skin is warm and dry.     Capillary Refill: Capillary refill takes less than 2 seconds.  Neurological:     General: No focal deficit present.     Mental Status: She is alert. Mental status is at baseline.     Motor: No weakness.  Psychiatric:        Mood and Affect: Mood normal.        Behavior: Behavior normal.     ED Results / Procedures / Treatments   Labs (all labs ordered are listed, but only abnormal results are displayed) Labs Reviewed  CBC WITH DIFFERENTIAL/PLATELET -  Abnormal; Notable for the following components:      Result Value   WBC 15.1 (*)    Neutro Abs 12.0 (*)    All other components within normal limits  COMPREHENSIVE METABOLIC PANEL - Abnormal; Notable for the following components:   Glucose, Bld 108 (*)    Total Bilirubin 1.5 (*)    All other components within normal limits  GASTROINTESTINAL PANEL BY PCR, STOOL (REPLACES STOOL CULTURE)  RESP PANEL BY RT-PCR (FLU A&B, COVID) ARPGX2  LIPASE, BLOOD  URINALYSIS, ROUTINE W REFLEX MICROSCOPIC  POC OCCULT BLOOD, ED    EKG None  Radiology No results found.  Procedures Procedures   Medications Ordered in  ED Medications - No data to display  ED Course  I have reviewed the triage vital signs and the nursing notes.  Pertinent labs & imaging results that were available during my care of the patient were reviewed by me and considered in my medical decision making (see chart for details).    MDM Rules/Calculators/A&P                          Bright blood blood per rectum setting of diarrhea.  Soft abdomen vital signs stable hemoglobin 13.  Kidney function normal.  Can we will admit this patient for observation.  Likely just infectious diarrhea however will evaluate need for emergent colonoscopy.  Currently hemodynamically stable.  Still having episodes.  COVID swab will be sent and pending.  GI pathogen panel will be collected.   Final Clinical Impression(s) / ED Diagnoses Final diagnoses:  Diarrhea in adult patient  BRBPR (bright red blood per rectum)    Rx / DC Orders ED Discharge Orders    None       Breck Coons, MD 01/13/21 2001

## 2021-01-14 DIAGNOSIS — G43909 Migraine, unspecified, not intractable, without status migrainosus: Secondary | ICD-10-CM

## 2021-01-14 DIAGNOSIS — Z8249 Family history of ischemic heart disease and other diseases of the circulatory system: Secondary | ICD-10-CM | POA: Diagnosis not present

## 2021-01-14 DIAGNOSIS — Z79899 Other long term (current) drug therapy: Secondary | ICD-10-CM | POA: Diagnosis not present

## 2021-01-14 DIAGNOSIS — R197 Diarrhea, unspecified: Secondary | ICD-10-CM

## 2021-01-14 DIAGNOSIS — F339 Major depressive disorder, recurrent, unspecified: Secondary | ICD-10-CM | POA: Diagnosis not present

## 2021-01-14 DIAGNOSIS — Z91013 Allergy to seafood: Secondary | ICD-10-CM | POA: Diagnosis not present

## 2021-01-14 DIAGNOSIS — Z803 Family history of malignant neoplasm of breast: Secondary | ICD-10-CM | POA: Diagnosis not present

## 2021-01-14 DIAGNOSIS — Z833 Family history of diabetes mellitus: Secondary | ICD-10-CM | POA: Diagnosis not present

## 2021-01-14 DIAGNOSIS — Z20822 Contact with and (suspected) exposure to covid-19: Secondary | ICD-10-CM | POA: Diagnosis present

## 2021-01-14 DIAGNOSIS — F32A Depression, unspecified: Secondary | ICD-10-CM | POA: Diagnosis present

## 2021-01-14 DIAGNOSIS — K921 Melena: Secondary | ICD-10-CM | POA: Diagnosis present

## 2021-01-14 LAB — BASIC METABOLIC PANEL
Anion gap: 8 (ref 5–15)
BUN: 9 mg/dL (ref 6–20)
CO2: 24 mmol/L (ref 22–32)
Calcium: 8.8 mg/dL — ABNORMAL LOW (ref 8.9–10.3)
Chloride: 106 mmol/L (ref 98–111)
Creatinine, Ser: 1.01 mg/dL — ABNORMAL HIGH (ref 0.44–1.00)
GFR, Estimated: >60 mL/min (ref >60)
Glucose, Bld: 119 mg/dL — ABNORMAL HIGH (ref 70–99)
Potassium: 3.6 mmol/L (ref 3.5–5.1)
Sodium: 138 mmol/L (ref 135–145)

## 2021-01-14 LAB — GASTROINTESTINAL PANEL BY PCR, STOOL (REPLACES STOOL CULTURE)

## 2021-01-14 LAB — HEMOGLOBIN AND HEMATOCRIT, BLOOD
HCT: 35.7 % — ABNORMAL LOW (ref 36.0–46.0)
Hemoglobin: 12.4 g/dL (ref 12.0–15.0)

## 2021-01-14 LAB — MAGNESIUM: Magnesium: 2 mg/dL (ref 1.7–2.4)

## 2021-01-14 LAB — HIV ANTIBODY (ROUTINE TESTING W REFLEX): HIV Screen 4th Generation wRfx: NONREACTIVE

## 2021-01-14 MED ORDER — DICYCLOMINE HCL 10 MG PO CAPS: 10.0000 mg | ORAL_CAPSULE | Freq: Three times a day (TID) | ORAL | Status: DC | PRN

## 2021-01-14 NOTE — Progress Notes (Signed)
Patient ID: Elizabeth Key, female   DOB: 1976/01/27, 45 y.o.   MRN: [id number]  PROGRESS NOTE    Elizabeth Key  DUK:[id number] DOB: 1976/03/18 DOA: 01/13/2021 PCP: [facility]   Brief Narrative:   45 y.o. female with medical history significant for depression and chronic migraines presented with abdominal cramping and bloody diarrhea.  On presentation, she was afebrile, slightly tachycardic and was found to have leukocytosis.  She was started on IV fluids.  Assessment & Plan:   Abdominal pain with bloody diarrhea Leukocytosis -Presented with a day of abdominal cramping along with diarrhea with blood and mucus -Still having bloody diarrhea.  Still has some abdominal cramps.  Continue IV fluids.  Follow stool for C. difficile and GI PCR.  Depression -Continue Wellbutrin  Chronic migraine -Continue topiramate    DVT prophylaxis: SCDs Code Status: Full Family Communication: None at bedside Disposition Plan: Status is: Observation  The patient will require care spanning > 2 midnights and should be moved to inpatient because: Inpatient level of care appropriate due to severity of illness  Dispo: The patient is from: Home              Anticipated d/c is to: Home              Patient currently is not medically stable to d/c.   Difficult to place patient No   Consultants: None  Procedures: None  Antimicrobials: Zithromax   Subjective: Patient seen and examined at bedside.  Still having bloody diarrhea and has intermittent abdominal cramps.  Denies fever, nausea or vomiting.  Objective: Vitals:   01/13/21 2015 01/13/21 2108 01/14/21 0152 01/14/21 0627  BP: (!) 140/98 134/90 121/72 (!) 140/97  Pulse: 80 78 75 72  Resp: 17 16 16 14   Temp: 98.6 F (37 C) 98.7 F (37.1 C) 98.6 F (37 C) 99.2 F (37.3 C)  TempSrc: Oral  Oral Oral  SpO2: 100% 100% 100% 100%  Weight:      Height:        Intake/Output Summary (Last 24 hours) at  01/14/2021 1037 Last data filed at 01/14/2021 1000 Gross per 24 hour  Intake 900 ml  Output --  Net 900 ml   Filed Weights   01/13/21 1812  Weight: 96.2 kg    Examination:  General exam: Appears calm and comfortable  Respiratory system: Bilateral decreased breath sounds at bases Cardiovascular system: S1 & S2 heard, Rate controlled Gastrointestinal system: Abdomen is nondistended, soft and mildly tender in the lower quadrant. Normal bowel sounds heard. Extremities: No cyanosis, clubbing, edema  Central nervous system: Alert and oriented. No focal neurological deficits. Moving extremities Skin: No rashes, lesions or ulcers Psychiatry: Judgement and insight appear normal. Mood & affect appropriate.     Data Reviewed: I have personally reviewed following labs and imaging studies  CBC: Recent Labs  Lab 01/13/21 1834 01/13/21 2147 01/14/21 0540  WBC 15.1*  --   --   NEUTROABS 12.0*  --   --   HGB 13.9 14.8 12.4  HCT 39.8 43.6 35.7*  MCV 82.6  --   --   PLT 350  --   --    Basic Metabolic Panel: Recent Labs  Lab 01/13/21 1834 01/14/21 0540  NA 140 138  K 3.9 3.6  CL 105 106  CO2 24 24  GLUCOSE 108* 119*  BUN 10 9  CREATININE 0.90 1.01*  CALCIUM 9.9 8.8*  MG  --  2.0  GFR: Estimated Creatinine Clearance: 82.3 mL/min (A) (by C-G formula based on SCr of 1.01 mg/dL (H)). Liver Function Tests: Recent Labs  Lab 01/13/21 1834  AST 19  ALT 18  ALKPHOS 65  BILITOT 1.5*  PROT 8.0  ALBUMIN 4.4   Recent Labs  Lab 01/13/21 1834  LIPASE 24   No results for input(s): AMMONIA in the last 168 hours. Coagulation Profile: No results for input(s): INR, PROTIME in the last 168 hours. Cardiac Enzymes: No results for input(s): CKTOTAL, CKMB, CKMBINDEX, TROPONINI in the last 168 hours. BNP (last 3 results) No results for input(s): PROBNP in the last 8760 hours. HbA1C: No results for input(s): HGBA1C in the last 72 hours. CBG: No results for input(s): GLUCAP in the  last 168 hours. Lipid Profile: No results for input(s): CHOL, HDL, LDLCALC, TRIG, CHOLHDL, LDLDIRECT in the last 72 hours. Thyroid Function Tests: No results for input(s): TSH, T4TOTAL, FREET4, T3FREE, THYROIDAB in the last 72 hours. Anemia Panel: No results for input(s): VITAMINB12, FOLATE, FERRITIN, TIBC, IRON, RETICCTPCT in the last 72 hours. Sepsis Labs: No results for input(s): PROCALCITON, LATICACIDVEN in the last 168 hours.  Recent Results (from the past 240 hour(s))  Resp Panel by RT-PCR (Flu A&B, Covid) Nasopharyngeal Swab     Status: None   Collection Time: 01/13/21  8:00 PM   Specimen: Nasopharyngeal Swab; Nasopharyngeal(NP) swabs in vial transport medium  Result Value Ref Range Status   SARS Coronavirus 2 by RT PCR NEGATIVE NEGATIVE Final    Comment: (NOTE) SARS-CoV-2 target nucleic acids are NOT DETECTED.  The SARS-CoV-2 RNA is generally detectable in upper respiratory specimens during the acute phase of infection. The lowest concentration of SARS-CoV-2 viral copies this assay can detect is 138 copies/mL. A negative result does not preclude SARS-Cov-2 infection and should not be used as the sole basis for treatment or other patient management decisions. A negative result may occur with  improper specimen collection/handling, submission of specimen other than nasopharyngeal swab, presence of viral mutation(s) within the areas targeted by this assay, and inadequate number of viral copies(<138 copies/mL). A negative result must be combined with clinical observations, patient history, and epidemiological information. The expected result is Negative.  Fact Sheet for Patients:  EntrepreneurPulse.com.au  Fact Sheet for Healthcare Providers:  IncredibleEmployment.be  This test is no t yet approved or cleared by the [place] FDA and  has been authorized for detection and/or diagnosis of SARS-CoV-2 by FDA under an Emergency Use  Authorization (EUA). This EUA will remain  in effect (meaning this test can be used) for the duration of the COVID-19 declaration under Section 564(b)(1) of the Act, 21 U.S.C.section 360bbb-3(b)(1), unless the authorization is terminated  or revoked sooner.       Influenza A by PCR NEGATIVE NEGATIVE Final   Influenza B by PCR NEGATIVE NEGATIVE Final    Comment: (NOTE) The Xpert Xpress SARS-CoV-2/FLU/RSV plus assay is intended as an aid in the diagnosis of influenza from Nasopharyngeal swab specimens and should not be used as a sole basis for treatment. Nasal washings and aspirates are unacceptable for Xpert Xpress SARS-CoV-2/FLU/RSV testing.  Fact Sheet for Patients: EntrepreneurPulse.com.au  Fact Sheet for Healthcare Providers: IncredibleEmployment.be  This test is not yet approved or cleared by the [place] FDA and has been authorized for detection and/or diagnosis of SARS-CoV-2 by FDA under an Emergency Use Authorization (EUA). This EUA will remain in effect (meaning this test can be used) for the duration of the COVID-19 declaration under Section  564(b)(1) of the Act, 21 U.S.C. section 360bbb-3(b)(1), unless the authorization is terminated or revoked.  Performed at [facility], [address], [place], [place] [address]          Radiology Studies: No results found.      Scheduled Meds: . azithromycin  500 mg Oral QHS  . buPROPion  300 mg Oral Daily  . sodium chloride flush  10-40 mL Intracatheter Q12H  . topiramate  50 mg Oral Daily   Continuous Infusions: . sodium chloride 100 mL/hr at 01/14/21 1000          Aline August, MD [facility] 01/14/2021, 10:37 AM

## 2021-01-15 LAB — CBC WITH DIFFERENTIAL/PLATELET
Abs Immature Granulocytes: 0.05 10*3/uL (ref 0.00–0.07)
Basophils Absolute: 0.1 10*3/uL (ref 0.0–0.1)
Basophils Relative: 0 %
Eosinophils Absolute: 0.2 10*3/uL (ref 0.0–0.5)
Eosinophils Relative: 1 %
HCT: 34.9 % — ABNORMAL LOW (ref 36.0–46.0)
Hemoglobin: 11.9 g/dL — ABNORMAL LOW (ref 12.0–15.0)
Immature Granulocytes: 0 %
Lymphocytes Relative: 20 %
Lymphs Abs: 2.6 10*3/uL (ref 0.7–4.0)
MCH: 28.5 pg (ref 26.0–34.0)
MCHC: 34.1 g/dL (ref 30.0–36.0)
MCV: 83.5 fL (ref 80.0–100.0)
Monocytes Absolute: 1.1 10*3/uL — ABNORMAL HIGH (ref 0.1–1.0)
Monocytes Relative: 8 %
Neutro Abs: 9.3 10*3/uL — ABNORMAL HIGH (ref 1.7–7.7)
Neutrophils Relative %: 71 %
Platelets: 287 10*3/uL (ref 150–400)
RBC: 4.18 MIL/uL (ref 3.87–5.11)
RDW: 12.1 % (ref 11.5–15.5)
WBC: 13.2 10*3/uL — ABNORMAL HIGH (ref 4.0–10.5)
nRBC: 0 % (ref 0.0–0.2)

## 2021-01-15 LAB — COMPREHENSIVE METABOLIC PANEL
ALT: 18 U/L (ref 0–44)
AST: 17 U/L (ref 15–41)
Albumin: 3.6 g/dL (ref 3.5–5.0)
Alkaline Phosphatase: 53 U/L (ref 38–126)
Anion gap: 8 (ref 5–15)
BUN: 6 mg/dL (ref 6–20)
CO2: 22 mmol/L (ref 22–32)
Calcium: 8.6 mg/dL — ABNORMAL LOW (ref 8.9–10.3)
Chloride: 108 mmol/L (ref 98–111)
Creatinine, Ser: 0.99 mg/dL (ref 0.44–1.00)
GFR, Estimated: >60 mL/min (ref >60)
Glucose, Bld: 96 mg/dL (ref 70–99)
Potassium: 3.3 mmol/L — ABNORMAL LOW (ref 3.5–5.1)
Sodium: 138 mmol/L (ref 135–145)
Total Bilirubin: 1.4 mg/dL — ABNORMAL HIGH (ref 0.3–1.2)
Total Protein: 6.5 g/dL (ref 6.5–8.1)

## 2021-01-15 LAB — MAGNESIUM: Magnesium: 2 mg/dL (ref 1.7–2.4)

## 2021-01-15 LAB — C DIFFICILE QUICK SCREEN W PCR REFLEX
C Diff antigen: NEGATIVE
C Diff interpretation: NOT DETECTED
C Diff toxin: NEGATIVE

## 2021-01-15 MED ORDER — POTASSIUM CHLORIDE CRYS ER 20 MEQ PO TBCR
40.0000 meq | EXTENDED_RELEASE_TABLET | Freq: Once | ORAL | Status: AC
  Administered 2021-01-15: 40 meq via ORAL
  Filled 2021-01-15: qty 2

## 2021-01-15 MED ORDER — AZITHROMYCIN 250 MG PO TABS
500.0000 mg | ORAL_TABLET | Freq: Once | ORAL | Status: AC
  Administered 2021-01-15: 500 mg via ORAL
  Filled 2021-01-15: qty 2

## 2021-01-15 NOTE — Plan of Care (Addendum)
Reviewed written dc instructions w pt and all questions answered. Pt d/c'ed per w/c in stable condition w all belongings.

## 2021-01-15 NOTE — Discharge Summary (Signed)
Physician Discharge Summary  Ogema GOT:[id number] DOB: 09/05/1975 DOA: 01/13/2021  PCP: [facility]  Admit date: 01/13/2021 Discharge date: 01/15/2021  Admitted From: Home Disposition: Home  Recommendations for Outpatient Follow-up:  1. Follow up with PCP in 1 week 2. Follow up in ED if symptoms worsen or new appear   Home Health: No Equipment/Devices: None  Discharge Condition: Stable CODE STATUS: Full Diet recommendation: Regular  Brief/Interim Summary: 45 y.o.femalewith medical history significant fordepression and chronic migraines presented with abdominal cramping and bloody diarrhea.  On presentation, she was afebrile, slightly tachycardic and was found to have leukocytosis.  She was started on IV fluids.  During the hospitalization, her condition has improved.  Her diarrhea is slowing down and not bloody anymore.  She feels okay going home today.  She will be discharged home today.  Discharge Diagnoses:   Abdominal pain with bloody diarrhea Leukocytosis -Presented with a day of abdominal cramping along with diarrhea with blood and mucus -Treated with IV fluids.  stool for C. difficile and GI PCR negative. - During the hospitalization, her condition has improved.  Her diarrhea is slowing down and not bloody anymore.  She feels okay going home today.  She will be discharged home today. -She was empirically treated with oral Zithromax, today will be her third dose.  No need for any more antibiotics at discharge.  Depression -Continue Wellbutrin  Chronic migraine -Continue topiramate    Discharge Instructions  Discharge Instructions    Diet general   Complete by: As directed    Increase activity slowly   Complete by: As directed      Allergies as of 01/15/2021      Reactions   Shellfish Allergy Anaphylaxis   ALL SEAFOOD causes this reaction      Medication List    TAKE these medications   buPROPion 150 MG 24 hr  tablet Commonly known as: WELLBUTRIN XL bupropion HCl XL 150 mg 24 hr tablet, extended release   SUMAtriptan 50 MG tablet Commonly known as: IMITREX Take 50 mg by mouth every 2 (two) hours as needed for migraine.   topiramate 50 MG tablet Commonly known as: TOPAMAX Take 50 mg by mouth daily.       Follow-up Information    [facility] Schedule an appointment as soon as possible for a visit in 1 week(s).   Specialty: Family Medicine Contact information: [address] [place] [address] [phone]              Allergies  Allergen Reactions  . Shellfish Allergy Anaphylaxis    ALL SEAFOOD causes this reaction    Consultations:  None   Procedures/Studies:  No results found.    Subjective: Patient seen and examined at bedside.  Feels much better.  Diarrhea is improving and not bloody for the last 3 bowel movements.  No overnight fever or vomiting reported.  Discharge Exam: Vitals:   01/14/21 2200 01/15/21 0633  BP: 116/75 122/72  Pulse: 79 79  Resp: 16 18  Temp: 99.1 F (37.3 C) 98.8 F (37.1 C)  SpO2: 99% 100%    General: Pt is alert, awake, not in acute distress Cardiovascular: rate controlled, S1/S2 + Respiratory: bilateral decreased breath sounds at bases Abdominal: Soft, NT, ND, bowel sounds + Extremities: no edema, no cyanosis    The results of significant diagnostics from this hospitalization (including imaging, microbiology, ancillary and laboratory) are listed below for reference.  Microbiology: Recent Results (from the past 240 hour(s))  Gastrointestinal Panel by PCR , Stool     Status: None   Collection Time: 01/13/21  7:58 PM   Specimen: Urine, Clean Catch; Stool  Result Value Ref Range Status   Campylobacter species NOT DETECTED NOT DETECTED Final   Plesimonas shigelloides NOT DETECTED NOT DETECTED Final   Salmonella species NOT DETECTED NOT DETECTED Final   Yersinia  enterocolitica NOT DETECTED NOT DETECTED Final   Vibrio species NOT DETECTED NOT DETECTED Final   Vibrio cholerae NOT DETECTED NOT DETECTED Final   Enteroaggregative E coli (EAEC) NOT DETECTED NOT DETECTED Final   Enteropathogenic E coli (EPEC) NOT DETECTED NOT DETECTED Final   Enterotoxigenic E coli (ETEC) NOT DETECTED NOT DETECTED Final   Shiga like toxin producing E coli (STEC) NOT DETECTED NOT DETECTED Final   Shigella/Enteroinvasive E coli (EIEC) NOT DETECTED NOT DETECTED Final   Cryptosporidium NOT DETECTED NOT DETECTED Final   Cyclospora cayetanensis NOT DETECTED NOT DETECTED Final   Entamoeba histolytica NOT DETECTED NOT DETECTED Final   Giardia lamblia NOT DETECTED NOT DETECTED Final   Adenovirus F40/41 NOT DETECTED NOT DETECTED Final   Astrovirus NOT DETECTED NOT DETECTED Final   Norovirus GI/GII NOT DETECTED NOT DETECTED Final   Rotavirus A NOT DETECTED NOT DETECTED Final   Sapovirus (I, II, IV, and V) NOT DETECTED NOT DETECTED Final    Comment: Performed at [facility], [address] [place], [place] [address]  Resp Panel by RT-PCR (Flu A&B, Covid) Nasopharyngeal Swab     Status: None   Collection Time: 01/13/21  8:00 PM   Specimen: Nasopharyngeal Swab; Nasopharyngeal(NP) swabs in vial transport medium  Result Value Ref Range Status   SARS Coronavirus 2 by RT PCR NEGATIVE NEGATIVE Final    Comment: (NOTE) SARS-CoV-2 target nucleic acids are NOT DETECTED.  The SARS-CoV-2 RNA is generally detectable in upper respiratory specimens during the acute phase of infection. The lowest concentration of SARS-CoV-2 viral copies this assay can detect is 138 copies/mL. A negative result does not preclude SARS-Cov-2 infection and should not be used as the sole basis for treatment or other patient management decisions. A negative result may occur with  improper specimen collection/handling, submission of specimen other than nasopharyngeal swab, presence of viral mutation(s)  within the areas targeted by this assay, and inadequate number of viral copies(<138 copies/mL). A negative result must be combined with clinical observations, patient history, and epidemiological information. The expected result is Negative.  Fact Sheet for Patients:  EntrepreneurPulse.com.au  Fact Sheet for Healthcare Providers:  IncredibleEmployment.be  This test is no t yet approved or cleared by the [place] FDA and  has been authorized for detection and/or diagnosis of SARS-CoV-2 by FDA under an Emergency Use Authorization (EUA). This EUA will remain  in effect (meaning this test can be used) for the duration of the COVID-19 declaration under Section 564(b)(1) of the Act, 21 U.S.C.section 360bbb-3(b)(1), unless the authorization is terminated  or revoked sooner.       Influenza A by PCR NEGATIVE NEGATIVE Final   Influenza B by PCR NEGATIVE NEGATIVE Final    Comment: (NOTE) The Xpert Xpress SARS-CoV-2/FLU/RSV plus assay is intended as an aid in the diagnosis of influenza from Nasopharyngeal swab specimens and should not be used as a sole basis for treatment. Nasal washings and aspirates are unacceptable for Xpert Xpress SARS-CoV-2/FLU/RSV testing.  Fact Sheet for Patients: EntrepreneurPulse.com.au  Fact Sheet for Healthcare Providers: IncredibleEmployment.be  This test  is not yet approved or cleared by the [place] and has been authorized for detection and/or diagnosis of SARS-CoV-2 by FDA under an Emergency Use Authorization (EUA). This EUA will remain in effect (meaning this test can be used) for the duration of the COVID-19 declaration under Section 564(b)(1) of the Act, 21 U.S.C. section 360bbb-3(b)(1), unless the authorization is terminated or revoked.  Performed at [facility], [place] [address] [place], [place] [address]   C Difficile Quick Screen w PCR  reflex     Status: None   Collection Time: 01/15/21  1:45 AM   Specimen: STOOL  Result Value Ref Range Status   C Diff antigen NEGATIVE NEGATIVE Corrected    Comment: CORRECTED ON 06/05 AT 0421: PREVIOUSLY REPORTED AS NON REACTIVE   C Diff toxin NEGATIVE NEGATIVE Corrected    Comment: CORRECTED ON 06/05 AT 0421: PREVIOUSLY REPORTED AS NON REACTIVE   C Diff interpretation No C. difficile detected.  Corrected    Comment: Performed at [facility], [place] [address] [place], [place] [address] CORRECTED ON 06/05 AT 0421: PREVIOUSLY REPORTED AS NEGATIVE      Labs: BNP (last 3 results) No results for input(s): BNP in the last 8760 hours. Basic Metabolic Panel: Recent Labs  Lab 01/13/21 1834 01/14/21 0540 01/15/21 0312  NA 140 138 138  K 3.9 3.6 3.3*  CL 105 106 108  CO2 24 24 22   GLUCOSE 108* 119* 96  BUN 10 9 6   CREATININE 0.90 1.01* 0.99  CALCIUM 9.9 8.8* 8.6*  MG  --  2.0 2.0   Liver Function Tests: Recent Labs  Lab 01/13/21 1834 01/15/21 0312  AST 19 17  ALT 18 18  ALKPHOS 65 53  BILITOT 1.5* 1.4*  PROT 8.0 6.5  ALBUMIN 4.4 3.6   Recent Labs  Lab 01/13/21 1834  LIPASE 24   No results for input(s): AMMONIA in the last 168 hours. CBC: Recent Labs  Lab 01/13/21 1834 01/13/21 2147 01/14/21 0540 01/15/21 0312  WBC 15.1*  --   --  13.2*  NEUTROABS 12.0*  --   --  9.3*  HGB 13.9 14.8 12.4 11.9*  HCT 39.8 43.6 35.7* 34.9*  MCV 82.6  --   --  83.5  PLT 350  --   --  287   Cardiac Enzymes: No results for input(s): CKTOTAL, CKMB, CKMBINDEX, TROPONINI in the last 168 hours. BNP: Invalid input(s): POCBNP CBG: No results for input(s): GLUCAP in the last 168 hours. D-Dimer No results for input(s): DDIMER in the last 72 hours. Hgb A1c No results for input(s): HGBA1C in the last 72 hours. Lipid Profile No results for input(s): CHOL, HDL, LDLCALC, TRIG, CHOLHDL, LDLDIRECT in the last 72 hours. Thyroid function studies No results for input(s):  TSH, T4TOTAL, T3FREE, THYROIDAB in the last 72 hours.  Invalid input(s): FREET3 Anemia work up No results for input(s): VITAMINB12, FOLATE, FERRITIN, TIBC, IRON, RETICCTPCT in the last 72 hours. Urinalysis    Component Value Date/Time   COLORURINE YELLOW 01/13/2021 1812   APPEARANCEUR CLEAR 01/13/2021 1812   LABSPEC 1.012 01/13/2021 1812   PHURINE 5.0 01/13/2021 1812   GLUCOSEU NEGATIVE 01/13/2021 1812   HGBUR NEGATIVE 01/13/2021 1812   BILIRUBINUR NEGATIVE 01/13/2021 1812   KETONESUR 20 (A) 01/13/2021 1812   PROTEINUR NEGATIVE 01/13/2021 1812   UROBILINOGEN 1.0 01/18/2013 0552   NITRITE NEGATIVE 01/13/2021 1812   LEUKOCYTESUR NEGATIVE 01/13/2021 1812   Sepsis Labs Invalid input(s): PROCALCITONIN,  WBC,  [facility] Microbiology Recent Results (from the past 240 hour(s))  Gastrointestinal Panel by PCR , Stool     Status: None   Collection Time: 01/13/21  7:58 PM   Specimen: Urine, Clean Catch; Stool  Result Value Ref Range Status   Campylobacter species NOT DETECTED NOT DETECTED Final   Plesimonas shigelloides NOT DETECTED NOT DETECTED Final   Salmonella species NOT DETECTED NOT DETECTED Final   Yersinia enterocolitica NOT DETECTED NOT DETECTED Final   Vibrio species NOT DETECTED NOT DETECTED Final   Vibrio cholerae NOT DETECTED NOT DETECTED Final   Enteroaggregative E coli (EAEC) NOT DETECTED NOT DETECTED Final   Enteropathogenic E coli (EPEC) NOT DETECTED NOT DETECTED Final   Enterotoxigenic E coli (ETEC) NOT DETECTED NOT DETECTED Final   Shiga like toxin producing E coli (STEC) NOT DETECTED NOT DETECTED Final   Shigella/Enteroinvasive E coli (EIEC) NOT DETECTED NOT DETECTED Final   Cryptosporidium NOT DETECTED NOT DETECTED Final   Cyclospora cayetanensis NOT DETECTED NOT DETECTED Final   Entamoeba histolytica NOT DETECTED NOT DETECTED Final   Giardia lamblia NOT DETECTED NOT DETECTED Final   Adenovirus F40/41 NOT DETECTED NOT DETECTED Final   Astrovirus NOT DETECTED  NOT DETECTED Final   Norovirus GI/GII NOT DETECTED NOT DETECTED Final   Rotavirus A NOT DETECTED NOT DETECTED Final   Sapovirus (I, II, IV, and V) NOT DETECTED NOT DETECTED Final    Comment: Performed at [facility], [address] [place],  [address]  Resp Panel by RT-PCR (Flu A&B, Covid) Nasopharyngeal Swab     Status: None   Collection Time: 01/13/21  8:00 PM   Specimen: Nasopharyngeal Swab; Nasopharyngeal(NP) swabs in vial transport medium  Result Value Ref Range Status   SARS Coronavirus 2 by RT PCR NEGATIVE NEGATIVE Final    Comment: (NOTE) SARS-CoV-2 target nucleic acids are NOT DETECTED.  The SARS-CoV-2 RNA is generally detectable in upper respiratory specimens during the acute phase of infection. The lowest concentration of SARS-CoV-2 viral copies this assay can detect is 138 copies/mL. A negative result does not preclude SARS-Cov-2 infection and should not be used as the sole basis for treatment or other patient management decisions. A negative result may occur with  improper specimen collection/handling, submission of specimen other than nasopharyngeal swab, presence of viral mutation(s) within the areas targeted by this assay, and inadequate number of viral copies(<138 copies/mL). A negative result must be combined with clinical observations, patient history, and epidemiological information. The expected result is Negative.  Fact Sheet for Patients:  EntrepreneurPulse.com.au  Fact Sheet for Healthcare Providers:  IncredibleEmployment.be  This test is no t yet approved or cleared by the [place] FDA and  has been authorized for detection and/or diagnosis of SARS-CoV-2 by FDA under an Emergency Use Authorization (EUA). This EUA will remain  in effect (meaning this test can be used) for the duration of the COVID-19 declaration under Section 564(b)(1) of the Act, 21 U.S.C.section 360bbb-3(b)(1), unless the  authorization is terminated  or revoked sooner.       Influenza A by PCR NEGATIVE NEGATIVE Final   Influenza B by PCR NEGATIVE NEGATIVE Final    Comment: (NOTE) The Xpert Xpress SARS-CoV-2/FLU/RSV plus assay is intended as an aid in the diagnosis of influenza from Nasopharyngeal swab specimens and should not be used as a sole basis for treatment. Nasal washings and aspirates are unacceptable for Xpert Xpress SARS-CoV-2/FLU/RSV testing.  Fact Sheet for Patients: EntrepreneurPulse.com.au  Fact Sheet for Healthcare Providers: IncredibleEmployment.be  This  test is not yet approved or cleared by the [place] and has been authorized for detection and/or diagnosis of SARS-CoV-2 by FDA under an Emergency Use Authorization (EUA). This EUA will remain in effect (meaning this test can be used) for the duration of the COVID-19 declaration under Section 564(b)(1) of the Act, 21 U.S.C. section 360bbb-3(b)(1), unless the authorization is terminated or revoked.  Performed at [facility], [place] [address], [place], [place] [address]   C Difficile Quick Screen w PCR reflex     Status: None   Collection Time: 01/15/21  1:45 AM   Specimen: STOOL  Result Value Ref Range Status   C Diff antigen NEGATIVE NEGATIVE Corrected    Comment: CORRECTED ON 06/05 AT 0421: PREVIOUSLY REPORTED AS NON REACTIVE   C Diff toxin NEGATIVE NEGATIVE Corrected    Comment: CORRECTED ON 06/05 AT 0421: PREVIOUSLY REPORTED AS NON REACTIVE   C Diff interpretation No C. difficile detected.  Corrected    Comment: Performed at [facility], [place] [address], [place], [place] [address] CORRECTED ON 06/05 AT 0421: PREVIOUSLY REPORTED AS NEGATIVE      Time coordinating discharge: 35 minutes  SIGNED:   Aline August, MD  [facility] 01/15/2021, 10:18 AM

## 2021-03-03 DIAGNOSIS — U071 COVID-19: Secondary | ICD-10-CM | POA: Insufficient documentation

## 2021-08-22 ENCOUNTER — Encounter: Payer: Self-pay | Admitting: Internal Medicine

## 2021-09-21 ENCOUNTER — Other Ambulatory Visit: Payer: Self-pay

## 2021-09-21 ENCOUNTER — Ambulatory Visit (AMBULATORY_SURGERY_CENTER): Payer: Self-pay

## 2021-09-21 VITALS — Ht 66.0 in | Wt 197.0 lb

## 2021-09-21 DIAGNOSIS — Z1211 Encounter for screening for malignant neoplasm of colon: Secondary | ICD-10-CM

## 2021-09-21 MED ORDER — NA SULFATE-K SULFATE-MG SULF 17.5-3.13-1.6 GM/177ML PO SOLN: 1.0000 | Freq: Once | ORAL | 0 refills | Status: AC

## 2021-09-21 NOTE — Progress Notes (Signed)
Denies allergies to eggs or soy products. Denies complication of anesthesia or sedation. Denies use of weight loss medication. Denies use of O2.   Emmi instructions given for colonoscopy.  

## 2021-10-04 ENCOUNTER — Encounter: Payer: Self-pay | Admitting: Internal Medicine

## 2021-10-06 ENCOUNTER — Encounter: Payer: Self-pay | Admitting: Internal Medicine

## 2021-10-06 ENCOUNTER — Ambulatory Visit (AMBULATORY_SURGERY_CENTER): Payer: BC Managed Care – PPO | Admitting: Internal Medicine

## 2021-10-06 ENCOUNTER — Other Ambulatory Visit: Payer: Self-pay

## 2021-10-06 VITALS — BP 108/63 | HR 85 | Temp 97.3°F | Resp 14 | Ht 66.0 in | Wt 197.0 lb

## 2021-10-06 DIAGNOSIS — D125 Benign neoplasm of sigmoid colon: Secondary | ICD-10-CM | POA: Diagnosis not present

## 2021-10-06 DIAGNOSIS — D122 Benign neoplasm of ascending colon: Secondary | ICD-10-CM

## 2021-10-06 DIAGNOSIS — Z1211 Encounter for screening for malignant neoplasm of colon: Secondary | ICD-10-CM | POA: Diagnosis not present

## 2021-10-06 HISTORY — PX: COLONOSCOPY: SHX174

## 2021-10-06 MED ORDER — SODIUM CHLORIDE 0.9 % IV SOLN: 500.0000 mL | Freq: Once | INTRAVENOUS | Status: DC

## 2021-10-06 NOTE — Progress Notes (Signed)
GASTROENTEROLOGY PROCEDURE H&P NOTE   Primary Care Physician: [facility]    Reason for Procedure:   Colon cancer screening  Plan:    Colonoscopy  Patient is appropriate for endoscopic procedure(s) in the ambulatory ([place]) setting.  The nature of the procedure, as well as the risks, benefits, and alternatives were carefully and thoroughly reviewed with the patient. Ample time for discussion and questions allowed. The patient understood, was satisfied, and agreed to proceed.     HPI: Elizabeth Key is a 46 y.o. female who presents for colonoscopy for colon cancer screening. She had one episode of rectal bleeding last year. Has not had any additional rectal bleeding since then. Denies changes in bowel habits or weight loss. Her grandfather had colon cancer.  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    Major depression    Migraines    Plantar fasciitis of right foot    PONV (postoperative nausea and vomiting)    low oxygen saturation in recovery   Sickle cell anemia (HCC)    Sickle cell trait (HCC)    Tension headache    Vitamin D deficiency     Past Surgical History:  Procedure Laterality Date   BREAST REDUCTION SURGERY     CESAREAN SECTION     LAPAROSCOPIC ASSISTED VAGINAL HYSTERECTOMY N/A 03/25/2018   Procedure: LAPAROSCOPIC ASSISTED VAGINAL HYSTERECTOMY;  Surgeon: Janyth Contes, MD;  Location: [facility];  Service: Gynecology;  Laterality: N/A;   LAPAROSCOPIC BILATERAL SALPINGECTOMY Bilateral 02/26/2014   Procedure: LAPAROSCOPIC BILATERAL SALPINGECTOMY;  Surgeon: Janyth Contes, MD;  Location: [facility];  Service: Gynecology;  Laterality: Bilateral;    Prior to Admission medications   Medication Sig  Start Date End Date Taking? Authorizing Provider  buPROPion (WELLBUTRIN XL) 150 MG 24 hr tablet bupropion HCl XL 150 mg 24 hr tablet, extended release 08/05/19  Yes [provider]  desoximetasone (TOPICORT) 0.25 % cream SMARTSIG:sparingly Topical Twice Daily 09/24/21  Yes [provider]  MOUNJARO 7.5 MG/0.5ML Pen SMARTSIG:7.5 Milligram(s) SUB-Q Once a Week 09/23/21  Yes [provider]  SUMAtriptan (IMITREX) 50 MG tablet Take 50 mg by mouth every 2 (two) hours as needed for migraine.   Yes [provider]  topiramate (TOPAMAX) 50 MG tablet Take 50 mg by mouth daily.   Yes [provider]  valACYclovir (VALTREX) 1000 MG tablet Take 1,000 mg by mouth as needed.   Yes [provider]    Current Outpatient Medications  Medication Sig Dispense Refill   buPROPion (WELLBUTRIN XL) 150 MG 24 hr tablet bupropion HCl XL 150 mg 24 hr tablet, extended release     desoximetasone (TOPICORT) 0.25 % cream SMARTSIG:sparingly Topical Twice Daily     MOUNJARO 7.5 MG/0.5ML Pen SMARTSIG:7.5 Milligram(s) SUB-Q Once a Week     SUMAtriptan (IMITREX) 50 MG tablet Take 50 mg by mouth every 2 (two) hours as needed for migraine.     topiramate (TOPAMAX) 50 MG tablet Take 50 mg by mouth daily.     valACYclovir (VALTREX) 1000 MG tablet Take 1,000 mg by mouth as needed.     Current Facility-Administered Medications  Medication Dose Route Frequency Provider Last Rate Last Admin   0.9 %  sodium chloride  infusion  500 mL Intravenous Once Sharyn Creamer, MD        Allergies as of 10/06/2021 - Review Complete 10/06/2021  Allergen Reaction Noted   Shellfish allergy Anaphylaxis 05/22/2012    Family History  Problem Relation Age of Onset   Hypertension Mother    Diabetes Mother    Breast cancer Cousin    Colon cancer Other    Esophageal cancer Neg Hx    Rectal cancer Neg Hx    Stomach cancer Neg Hx     Social History   Socioeconomic History   Marital status:  Single    Spouse name: Not on file   Number of children: Not on file   Years of education: Not on file   Highest education level: Not on file  Occupational History   Not on file  Tobacco Use   Smoking status: Never   Smokeless tobacco: Never  Vaping Use   Vaping Use: Never used  Substance and Sexual Activity   Alcohol use: No   Drug use: No   Sexual activity: Not on file  Other Topics Concern   Not on file  Social History Narrative   Not on file   Social Determinants of Health   Financial Resource Strain: Not on file  Food Insecurity: Not on file  Transportation Needs: Not on file  Physical Activity: Not on file  Stress: Not on file  Social Connections: Not on file  Intimate Partner Violence: Not on file    Physical Exam: Vital signs in last 24 hours: BP (!) 145/105    Pulse 91    Temp (!) 97.3 F (36.3 C)    Ht 5\' 6"  (1.676 m)    Wt 197 lb (89.4 kg)    LMP 03/10/2018 (Exact Date)    SpO2 100%    BMI 31.80 kg/m  GEN: NAD EYE: Sclerae anicteric ENT: MMM CV: Non-tachycardic Pulm: No increased work of breathing GI: Soft, NT/ND NEURO:  Alert & Oriented   [name], MD [facility]  10/06/2021 8:03 AM

## 2021-10-06 NOTE — Progress Notes (Signed)
C.W. vital signs. 

## 2021-10-06 NOTE — Progress Notes (Signed)
Pt's states no medical or surgical changes since previsit or office visit. 

## 2021-10-06 NOTE — Patient Instructions (Signed)
Resume previous diet and medications. Awaiting pathology results. Repeat Colonoscopy date to be determined based on pathology.  YOU HAD AN ENDOSCOPIC PROCEDURE TODAY AT [facility]:   Refer to the procedure report that was given to you for any specific questions about what was found during the examination.  If the procedure report does not answer your questions, please call your gastroenterologist to clarify.  If you requested that your care partner not be given the details of your procedure findings, then the procedure report has been included in a sealed envelope for you to review at your convenience later.  YOU SHOULD EXPECT: Some feelings of bloating in the abdomen. Passage of more gas than usual.  Walking can help get rid of the air that was put into your GI tract during the procedure and reduce the bloating. If you had a lower endoscopy (such as a colonoscopy or flexible sigmoidoscopy) you may notice spotting of blood in your stool or on the toilet paper. If you underwent a bowel prep for your procedure, you may not have a normal bowel movement for a few days.  Please Note:  You might notice some irritation and congestion in your nose or some drainage.  This is from the oxygen used during your procedure.  There is no need for concern and it should clear up in a day or so.  SYMPTOMS TO REPORT IMMEDIATELY:  Following lower endoscopy (colonoscopy or flexible sigmoidoscopy):  Excessive amounts of blood in the stool  Significant tenderness or worsening of abdominal pains  Swelling of the abdomen that is new, acute  Fever of 100F or higher  For urgent or emergent issues, a gastroenterologist can be reached at any hour by calling [phone]. Do not use MyChart messaging for urgent concerns.    DIET:  We do recommend a small meal at first, but then you may proceed to your regular diet.  Drink plenty of fluids but you should avoid alcoholic beverages for 24 hours.  ACTIVITY:   You should plan to take it easy for the rest of today and you should NOT DRIVE or use heavy machinery until tomorrow (because of the sedation medicines used during the test).    FOLLOW UP: Our staff will call the number listed on your records 48-72 hours following your procedure to check on you and address any questions or concerns that you may have regarding the information given to you following your procedure. If we do not reach you, we will leave a message.  We will attempt to reach you two times.  During this call, we will ask if you have developed any symptoms of COVID 19. If you develop any symptoms (ie: fever, flu-like symptoms, shortness of breath, cough etc.) before then, please call [phone].  If you test positive for Covid 19 in the 2 weeks post procedure, please call and report this information to [place].    If any biopsies were taken you will be contacted by phone or by letter within the next 1-3 weeks.  Please call us at [phone] if you have not heard about the biopsies in 3 weeks.    SIGNATURES/CONFIDENTIALITY: You and/or your care partner have signed paperwork which will be entered into your electronic medical record.  These signatures attest to the fact that that the information above on your After Visit Summary has been reviewed and is understood.  Full responsibility of the confidentiality of this discharge information lies with you and/or your care-partner.

## 2021-10-06 NOTE — Progress Notes (Signed)
Called to room to assist during endoscopic procedure.  Patient ID and intended procedure confirmed with present staff. Received instructions for my participation in the procedure from the performing physician.  

## 2021-10-06 NOTE — Progress Notes (Signed)
To Pacu, VSS. Report to [email or web address] 

## 2021-10-06 NOTE — Op Note (Signed)
[facility] Patient Name: Elizabeth Key Procedure Date: 10/06/2021 8:04 AM MRN: [id number] Endoscopist: Sonny Masters "Elizabeth Key ,  Age: 46 Referring MD:  Date of Birth: [birth year] Gender: Female Account #: [id number] Procedure:                Colonoscopy Indications:              Screening for colorectal malignant neoplasm, This                            is the patient's first colonoscopy Medicines:                Monitored Anesthesia Care Procedure:                Pre-Anesthesia Assessment:                           - Prior to the procedure, a History and Physical                            was performed, and patient medications and                            allergies were reviewed. The patient's tolerance of                            previous anesthesia was also reviewed. The risks                            and benefits of the procedure and the sedation                            options and risks were discussed with the patient.                            All questions were answered, and informed consent                            was obtained. Prior Anticoagulants: The patient has                            taken no previous anticoagulant or antiplatelet                            agents. ASA Grade Assessment: II - A patient with                            mild systemic disease. After reviewing the risks                            and benefits, the patient was deemed in                            satisfactory condition to undergo the procedure.  After obtaining informed consent, the colonoscope                            was passed under direct vision. Throughout the                            procedure, the patient's blood pressure, pulse, and                            oxygen saturations were monitored continuously. The                            Olympus CF-HQ190L [id number]) Colonoscope was                            introduced through the anus  and advanced to the the                            terminal ileum. The colonoscopy was performed                            without difficulty. The patient tolerated the                            procedure well. The quality of the bowel                            preparation was excellent. The terminal ileum,                            ileocecal valve, appendiceal orifice, and rectum                            were photographed. Scope In: 8:08:49 AM Scope Out: 8:28:56 AM Scope Withdrawal Time: 0 hours 14 minutes 19 seconds  Total Procedure Duration: 0 hours 20 minutes 7 seconds  Findings:                 The terminal ileum appeared normal.                           Three sessile polyps were found in the sigmoid                            colon and ascending colon. The polyps were 2 to 3                            mm in size. These polyps were removed with a cold                            snare. Resection and retrieval were complete.                           Non-bleeding internal hemorrhoids were found during  retroflexion. Complications:            No immediate complications. Estimated Blood Loss:     Estimated blood loss was minimal. Impression:               - The examined portion of the ileum was normal.                           - Three 2 to 3 mm polyps in the sigmoid colon and                            in the ascending colon, removed with a cold snare.                            Resected and retrieved.                           - Non-bleeding internal hemorrhoids. Recommendation:           - Discharge patient to home (with escort).                           - Await pathology results.                           - The findings and recommendations were discussed                            with the patient. Sonny Masters "Elizabeth Key,  10/06/2021 8:33:41 AM

## 2021-10-10 ENCOUNTER — Telehealth: Payer: Self-pay | Admitting: *Deleted

## 2021-10-10 ENCOUNTER — Telehealth: Payer: Self-pay

## 2021-10-10 NOTE — Telephone Encounter (Signed)
First attempt, left VM.  

## 2021-10-10 NOTE — Telephone Encounter (Signed)
Left message on 2nd follow up call. 

## 2021-10-11 ENCOUNTER — Encounter: Payer: Self-pay | Admitting: Internal Medicine

## 2021-10-13 ENCOUNTER — Encounter: Payer: Self-pay | Admitting: Internal Medicine

## 2021-11-17 DIAGNOSIS — D125 Benign neoplasm of sigmoid colon: Secondary | ICD-10-CM | POA: Insufficient documentation

## 2022-03-21 ENCOUNTER — Encounter (INDEPENDENT_AMBULATORY_CARE_PROVIDER_SITE_OTHER): Payer: Self-pay

## 2022-04-26 IMAGING — MG MM DIGITAL DIAGNOSTIC UNILAT*L* W/ TOMO W/ CAD
4 series · 4 of 12 positions shown · non-contrast
Comparison: Previous exam(s).

CLINICAL DATA: Recall from screening mammography with tomosynthesis
in 9761, possible mass involving the OUTER LEFT breast at MIDDLE to
POSTERIOR depth. Patient has undergone prior BILATERAL reduction
mammoplasty.

EXAM:
DIGITAL DIAGNOSTIC UNILATERAL LEFT MAMMOGRAM WITH TOMO

[L MLO synth-2D]
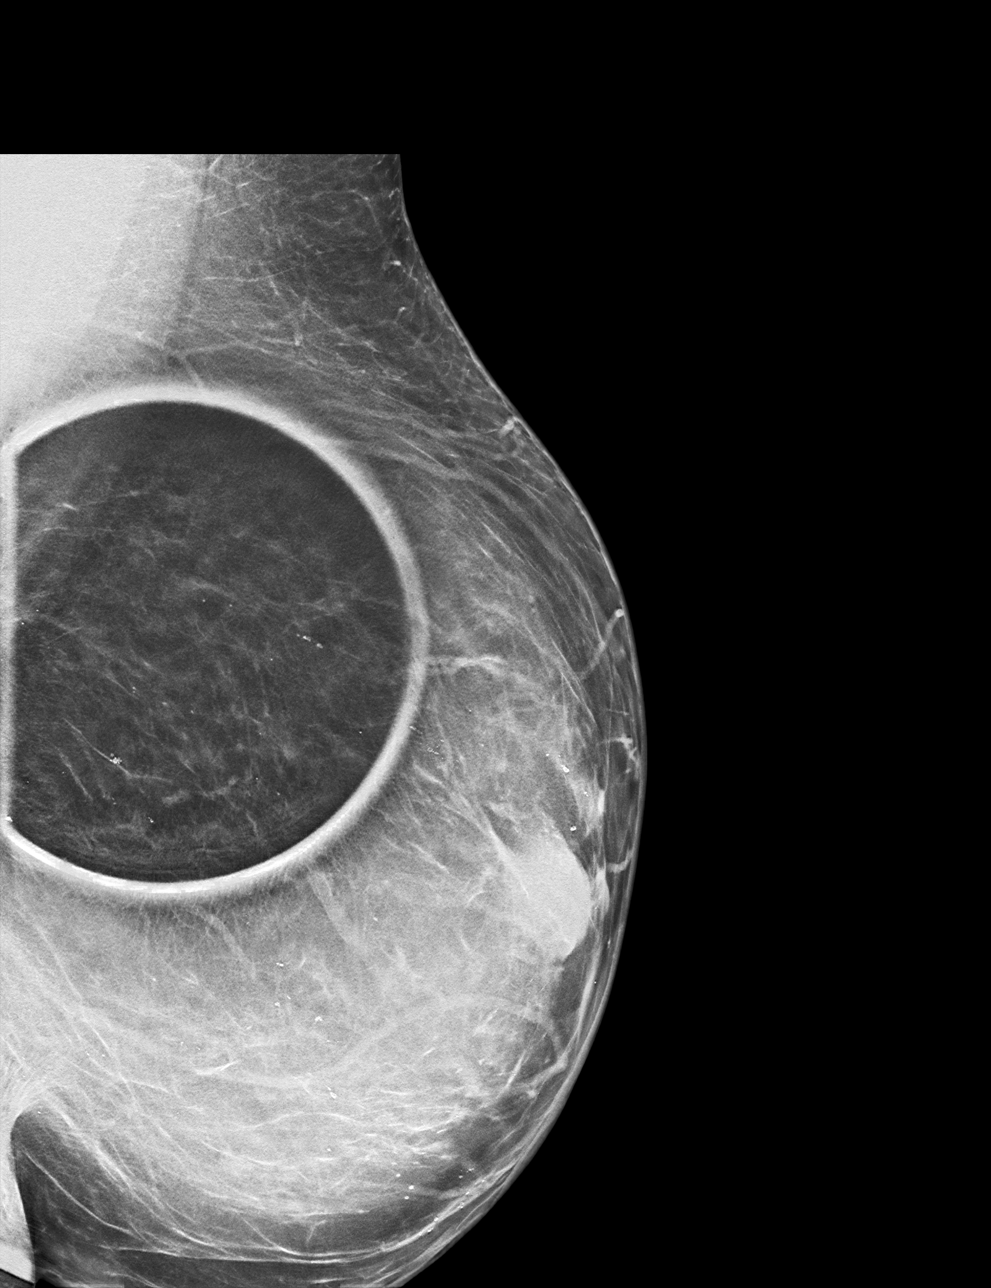

[L CC synth-2D]
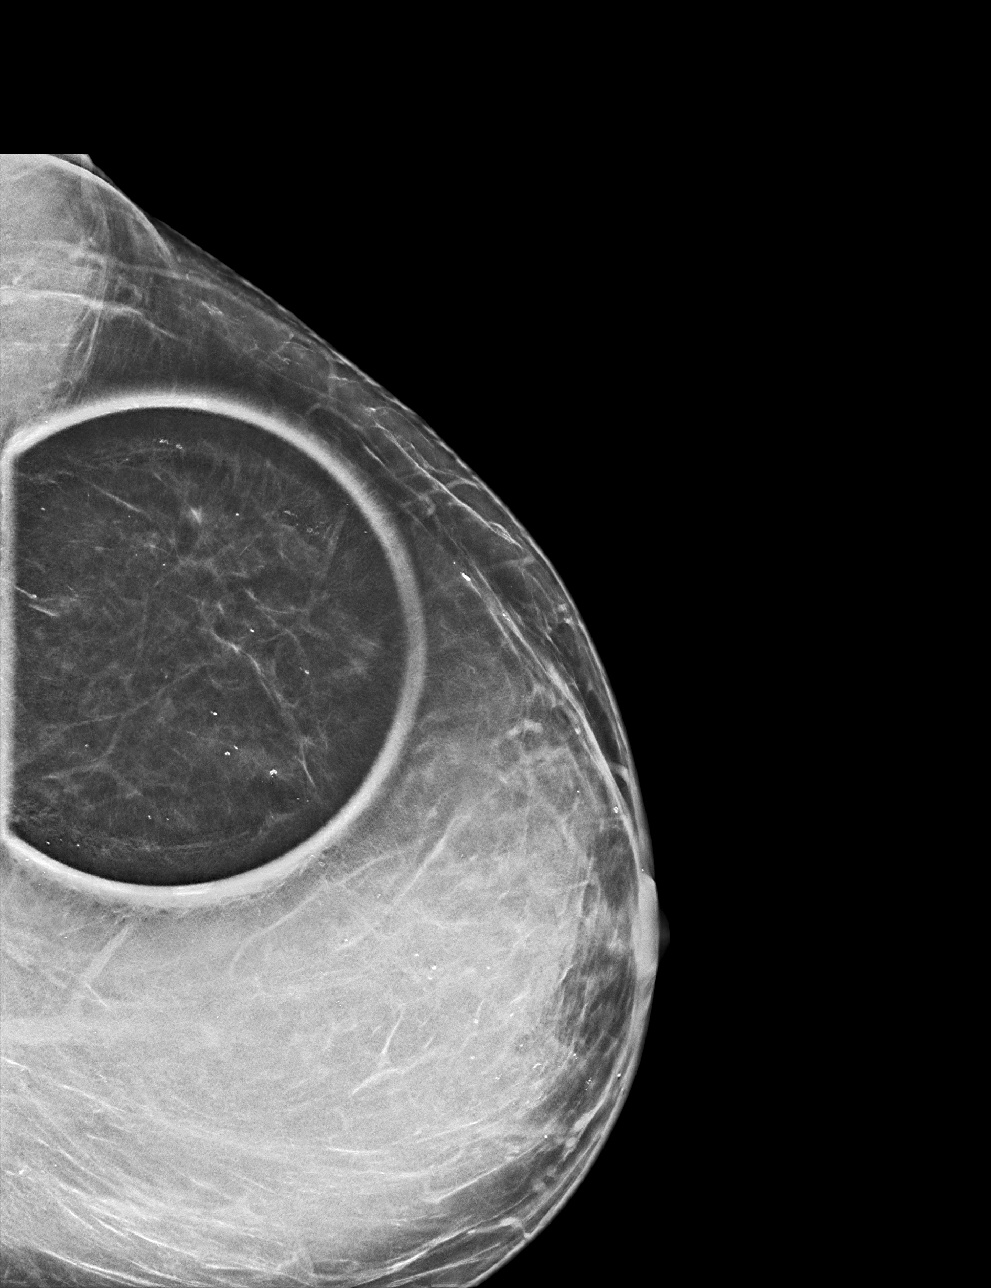

[L CC tomo · tomo slice 32/63.0]
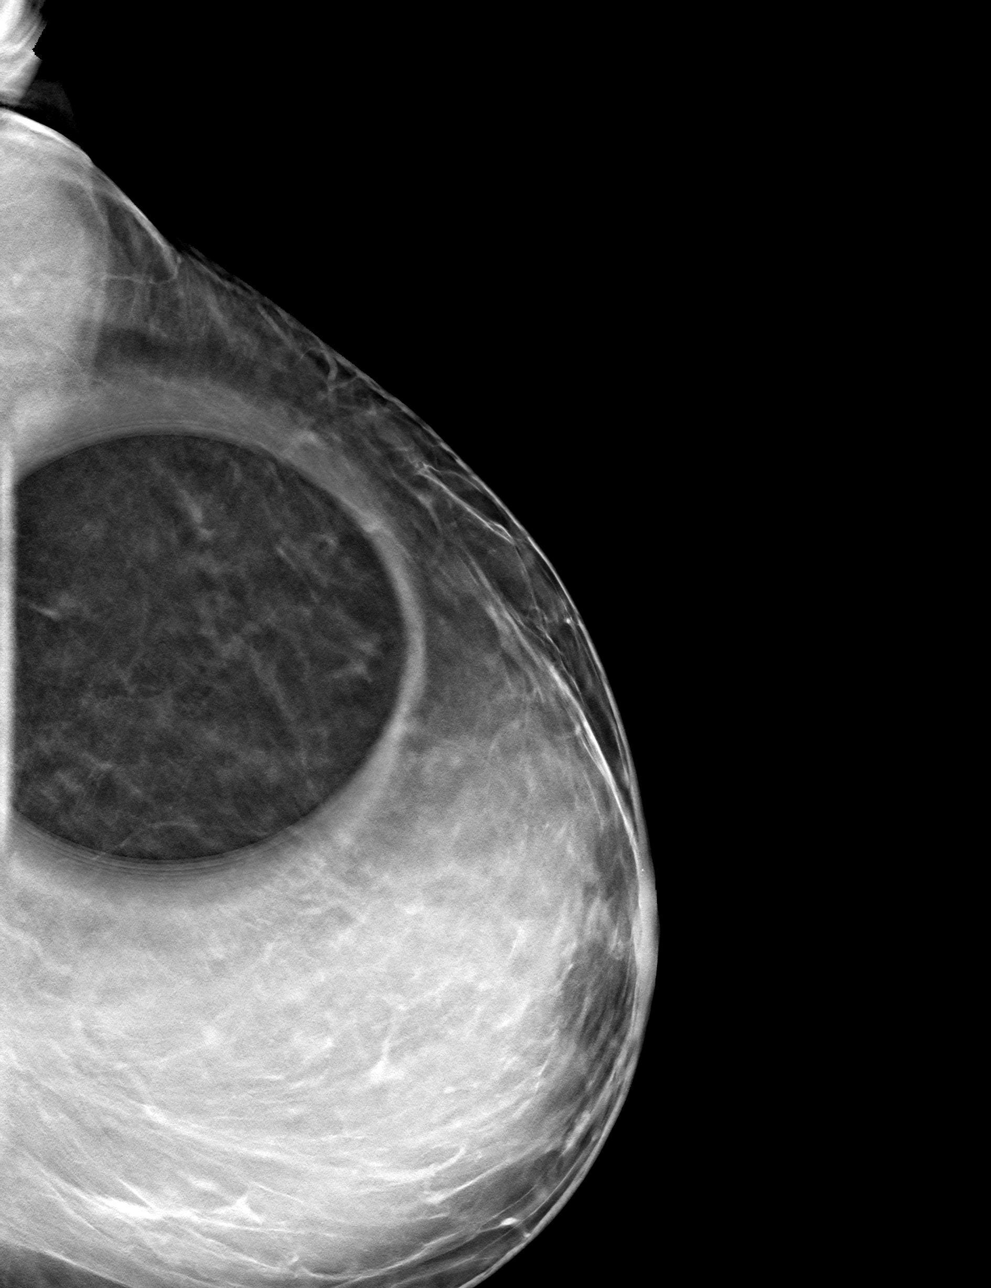

[L MLO tomo · tomo slice 33/66.0]
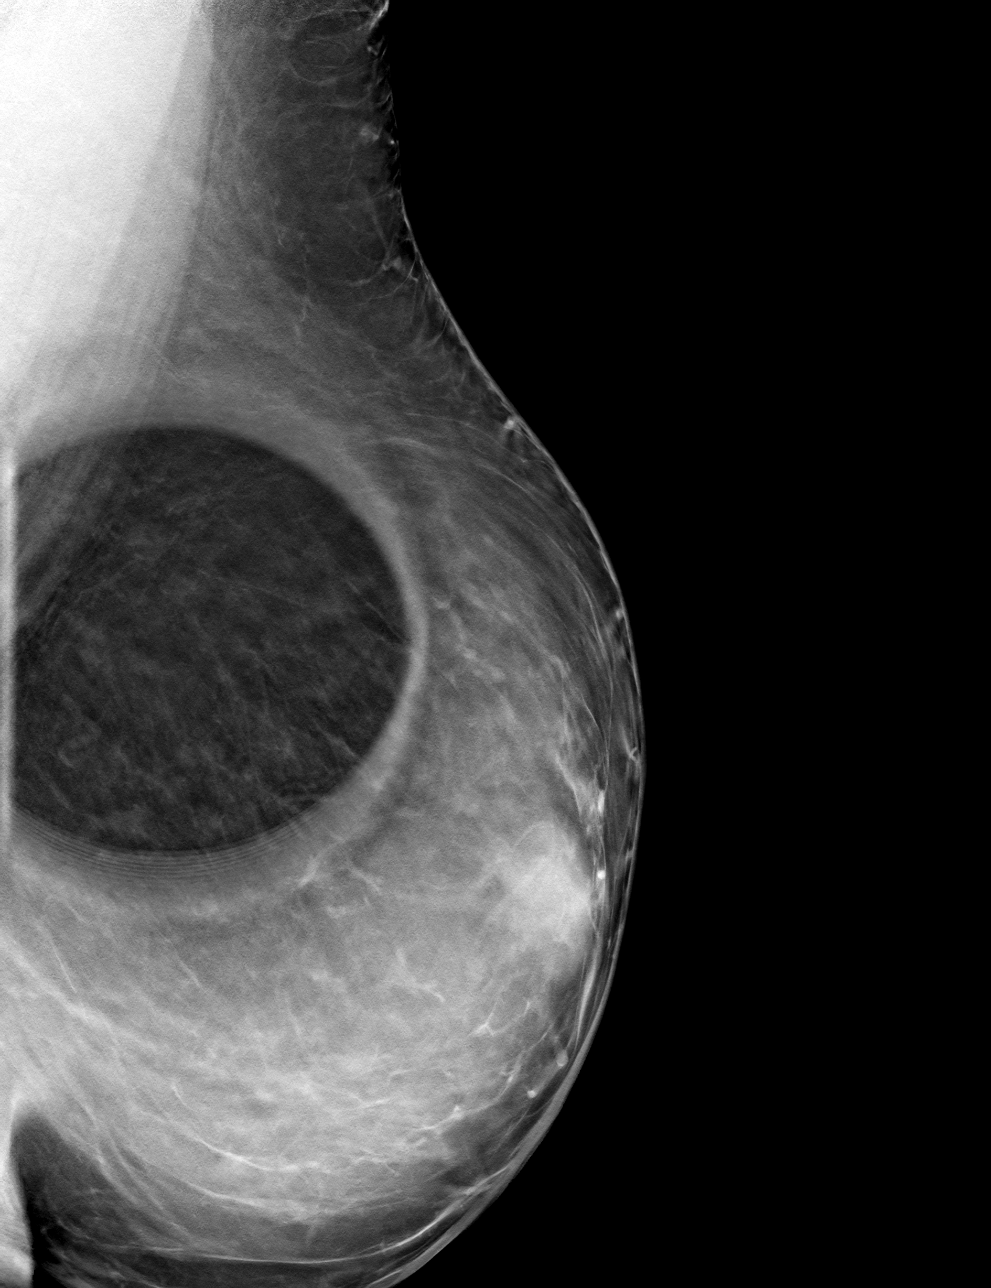

[4 of 12 positions shown; findings below may reference images not displayed]

ACR Breast Density Category b: There are scattered areas of
fibroglandular density.
FINDINGS: Tomosynthesis and synthesized spot-compression CC and MLO views of
the area of concern in the LEFT breast were obtained.

The mass or focal asymmetry questioned on the prior mammogram in
9761 disperses with compression. There is no underlying mass or
unexpected architectural distortion. Scarring related to the prior
reduction mammoplasty is again demonstrated.
IMPRESSION: No mammographic evidence of malignancy involving the LEFT breast.

RECOMMENDATION:
Screening mammogram in one year.(Code:[id number]-R-LEN)

I have discussed the findings and recommendations with the patient.
If applicable, a reminder letter will be sent to the patient
regarding the next appointment.

BI-RADS CATEGORY  2: Benign.

## 2023-08-21 DIAGNOSIS — L309 Dermatitis, unspecified: Secondary | ICD-10-CM | POA: Insufficient documentation

## 2024-02-25 ENCOUNTER — Ambulatory Visit: Admitting: Podiatry

## 2024-03-05 ENCOUNTER — Encounter: Payer: Self-pay | Admitting: Podiatry

## 2024-03-05 ENCOUNTER — Ambulatory Visit (INDEPENDENT_AMBULATORY_CARE_PROVIDER_SITE_OTHER)

## 2024-03-05 ENCOUNTER — Ambulatory Visit: Admitting: Podiatry

## 2024-03-05 DIAGNOSIS — Q666 Other congenital valgus deformities of feet: Secondary | ICD-10-CM

## 2024-03-05 DIAGNOSIS — M7751 Other enthesopathy of right foot: Secondary | ICD-10-CM

## 2024-03-05 MED ORDER — MELOXICAM 15 MG PO TABS: 15.0000 mg | ORAL_TABLET | Freq: Every day | ORAL | 3 refills | Status: AC

## 2024-03-08 NOTE — Progress Notes (Signed)
 Subjective:  Patient ID: Elizabeth Key, female    DOB: Nov 24, 1975,  MRN: [id number] HPI Chief Complaint  Patient presents with   Foot Pain    1st MPJ right - aching x 2 months, no injury, tried Ibuprofen    New Patient (Initial Visit)    Est pt 2019    48 y.o. female presents with the above complaint.   ROS: Denies fever chills nausea vomiting muscle aches pains calf pain back pain chest pain shortness of breath.  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    Major depression    Migraines    Plantar fasciitis of right foot    PONV (postoperative nausea and vomiting)    low oxygen saturation in recovery   Sickle cell anemia (HCC)    Sickle cell trait (HCC)    Tension headache    Vitamin D deficiency    Past Surgical History:  Procedure Laterality Date   BREAST REDUCTION SURGERY     CESAREAN SECTION     COLONOSCOPY  10/06/2021   LAPAROSCOPIC ASSISTED VAGINAL HYSTERECTOMY N/A 03/25/2018   Procedure: LAPAROSCOPIC ASSISTED VAGINAL HYSTERECTOMY;  Surgeon: Danielle Rom, MD;  [facility]  [facility];  Service: Gynecology;  Laterality: N/A;   LAPAROSCOPIC BILATERAL SALPINGECTOMY Bilateral 02/26/2014   Procedure: LAPAROSCOPIC BILATERAL SALPINGECTOMY;  Surgeon: Rom Danielle, MD;  Location: [facility];  Service: Gynecology;  Laterality: Bilateral;    Current Outpatient Medications:    meloxicam  (MOBIC ) 15 MG tablet, Take 1 tablet (15 mg total) by mouth daily., Disp: 30 tablet, Rfl: 3   Rimegepant Sulfate (NURTEC PO), Take by mouth., Disp: , Rfl:    desoximetasone (TOPICORT) 0.25 % cream, SMARTSIG:sparingly Topical Twice Daily, Disp: , Rfl:    valACYclovir (VALTREX) 1000 MG tablet, Take 1,000 mg by mouth as needed., Disp: , Rfl:   Allergies  Allergen  Reactions   Shellfish Allergy Anaphylaxis    ALL SEAFOOD causes this reaction   Review of Systems Objective:  There were no vitals filed for this visit.  General: Well developed, nourished, in no acute distress, alert and oriented x3   Dermatological: Skin is warm, dry and supple bilateral. Nails x 10 are well maintained; remaining integument appears unremarkable at this time. There are no open sores, no preulcerative lesions, no rash or signs of infection present.  Vascular: Dorsalis Pedis artery and Posterior Tibial artery pedal pulses are 2/4 bilateral with immedate capillary fill time. Pedal hair growth present. No varicosities and no lower extremity edema present bilateral.   Neruologic: Grossly intact via light touch bilateral. Vibratory intact via tuning fork bilateral. Protective threshold with Semmes Wienstein monofilament intact to all pedal sites bilateral. Patellar and Achilles deep tendon reflexes 2+ bilateral. No Babinski or clonus noted bilateral.   Musculoskeletal: No gross boney pedal deformities bilateral. No pain, crepitus, or limitation noted with foot and ankle range of motion bilateral. Muscular strength 5/5 in all groups tested bilateral.  She has tenderness and pain on attempted range of motion of the first metatarsophalangeal joint of that right foot.  There is some crepitation noted.  Gait: Unassisted, Nonantalgic.    Radiographs:  Radiographs demonstrate osseously mature individual with good bone mineralization.  Moderate to severe pes planovalgus with an elevated first metatarsal phalangeal joint  resulting in the hallux limitus most likely.  There is no spurring noted around the joint.    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  Plan: Discussed etiology pathology conservative surgical therapies.  Offered her an injection she declined today.  I did express to her that a stiff soled shoe would be best for her to  wear at this point we dispensed a Darco shoe and started her on meloxicam .  I like to follow-up with her in 1 month questions or concerns she will notify us  immediately.     Aleksa Catterton T. [place], [place]
